# Patient Record
Sex: Female | Born: 1960 | Race: Black or African American | Hispanic: No | Marital: Married | Smoking: Never smoker
Health system: Southern US, Community
[De-identification: ages and names within clinical notes are randomized; demographics above are authoritative.]

## PROBLEM LIST (undated history)

## (undated) DIAGNOSIS — E785 Hyperlipidemia, unspecified: Secondary | ICD-10-CM

## (undated) DIAGNOSIS — F419 Anxiety disorder, unspecified: Secondary | ICD-10-CM

## (undated) DIAGNOSIS — I10 Essential (primary) hypertension: Secondary | ICD-10-CM

## (undated) HISTORY — PX: COLONOSCOPY: SHX174

## (undated) HISTORY — PX: POLYPECTOMY: SHX149

## (undated) HISTORY — DX: Essential (primary) hypertension: I10

## (undated) HISTORY — DX: Anxiety disorder, unspecified: F41.9

## (undated) HISTORY — DX: Hyperlipidemia, unspecified: E78.5

---
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
## 2000-09-29 ENCOUNTER — Other Ambulatory Visit: Admission: RE | Admit: 2000-09-29 | Discharge: 2000-09-29 | Payer: Self-pay | Admitting: Obstetrics and Gynecology

## 2001-11-28 ENCOUNTER — Other Ambulatory Visit: Admission: RE | Admit: 2001-11-28 | Discharge: 2001-11-28 | Payer: Self-pay | Admitting: Obstetrics and Gynecology

## 2003-02-04 ENCOUNTER — Other Ambulatory Visit: Admission: RE | Admit: 2003-02-04 | Discharge: 2003-02-04 | Payer: Self-pay | Admitting: Obstetrics and Gynecology

## 2003-10-20 ENCOUNTER — Encounter: Admission: RE | Admit: 2003-10-20 | Discharge: 2003-10-20 | Payer: Self-pay | Admitting: Family Medicine

## 2004-03-08 ENCOUNTER — Other Ambulatory Visit: Admission: RE | Admit: 2004-03-08 | Discharge: 2004-03-08 | Payer: Self-pay | Admitting: Obstetrics and Gynecology

## 2005-04-13 ENCOUNTER — Other Ambulatory Visit: Admission: RE | Admit: 2005-04-13 | Discharge: 2005-04-13 | Payer: Self-pay | Admitting: Obstetrics and Gynecology

## 2008-08-28 ENCOUNTER — Encounter: Payer: Self-pay | Admitting: Internal Medicine

## 2008-09-16 ENCOUNTER — Ambulatory Visit: Payer: Self-pay | Admitting: Internal Medicine

## 2008-09-30 ENCOUNTER — Telehealth: Payer: Self-pay | Admitting: Internal Medicine

## 2008-11-14 ENCOUNTER — Ambulatory Visit: Payer: Self-pay | Admitting: Internal Medicine

## 2008-11-24 ENCOUNTER — Encounter: Payer: Self-pay | Admitting: Internal Medicine

## 2008-11-24 ENCOUNTER — Ambulatory Visit: Payer: Self-pay | Admitting: Internal Medicine

## 2008-11-25 ENCOUNTER — Encounter: Payer: Self-pay | Admitting: Internal Medicine

## 2009-12-15 ENCOUNTER — Encounter: Admission: RE | Admit: 2009-12-15 | Discharge: 2009-12-15 | Payer: Self-pay | Admitting: Obstetrics and Gynecology

## 2010-03-09 NOTE — Miscellaneous (Signed)
Summary: [facility] Previsit/prep  Clinical Lists Changes  Medications: Added new medication of DULCOLAX 5 MG  TBEC (BISACODYL) Day before procedure take 2 at 3pm and 2 at 8pm. - Signed Added new medication of METOCLOPRAMIDE HCL 10 MG  TABS (METOCLOPRAMIDE HCL) As per prep instructions. - Signed Added new medication of MIRALAX   POWD (POLYETHYLENE GLYCOL 3350) As per prep  instructions. - Signed Rx of DULCOLAX 5 MG  TBEC (BISACODYL) Day before procedure take 2 at 3pm and 2 at 8pm.;  #4 x 0;  Signed;  Entered by: Wyona Almas RN;  Authorized by: Hart Carwin MD;  Method used: Electron[facility] to [facility]  [phone]*, [address] [place], [place], [place]  [address], Ph: [phone] or [phone], Fax: [phone] Rx of METOCLOPRAMIDE HCL 10 MG  TABS (METOCLOPRAMIDE HCL) As per prep instructions.;  #2 x 0;  Signed;  Entered by: Wyona Almas RN;  Authorized by: Hart Carwin MD;  Method used: Electronically to [facility]  [phone]*, [address] [place], [place], [place]  [address], Ph: [phone] or [phone], Fax: [phone] Rx of MIRALAX   POWD (POLYETHYLENE GLYCOL 3350) As per prep  instructions.;  #255gm x 0;  Signed;  Entered by: Wyona Almas RN;  Authorized by: Hart Carwin MD;  Method used: [facility]  [phone]*, [address] [place], [place], [place]  [address], Ph: [phone] or [phone], Fax: [phone] Observations: Added new observation of NKA: T (09/16/2008 16:23)    Prescriptions: MIRALAX   POWD (POLYETHYLENE GLYCOL 3350) As per prep  instructions.  #255gm x 0   Entered by:   Wyona Almas RN   Authorized by:   Hart Carwin MD   Signed by:   Wyona Almas RN on 09/16/2008   Method used:   [facility] to        [facility]  [address] [phone]* (retail)       [address] [place], [place]  [address]       Ph: [phone] or [phone]       Fax: [phone]   RxID:   [id number] METOCLOPRAMIDE HCL 10 MG  TABS (METOCLOPRAMIDE HCL) As per prep  instructions.  #2 x 0   Entered by:   Wyona Almas RN   Authorized by:   Hart Carwin MD   Signed by:   Wyona Almas RN on 09/16/2008   Method used:   [facility]ronically to        [facility]  [address] [phone][facility] (retail)       [address]       [place], [place]  [address]       Ph: [phone] or [phone]       Fax: [phone]   RxID:   [id number] DULCOLAX 5 MG  TBEC (BISACODYL) Day before procedure take 2 at 3pm and 2 at 8pm.  #4 x 0   Entered by:   Wyona Almas RN   Authorized by:   Hart Carwin MD   Signed by:   Wyona Almas RN on 09/16/2008   Method used:   [facility]ronically to        [facility]  [address] [phone]* (retail)       [address]       [place], [place]  [address]       Ph: [phone] or [phone]       Fax: [phone]   RxID:   [id number]

## 2010-03-09 NOTE — Miscellaneous (Signed)
Summary: [facility] previsit  Clinical Lists Changes  Medications: Added new medication of MIRALAX   POWD (POLYETHYLENE GLYCOL 3350) As per prep  instructions. - Signed Added new medication of REGLAN 10 MG  TABS (METOCLOPRAMIDE HCL) As per prep instructions. - Signed Added new medication of DULCOLAX 5 MG  TBEC (BISACODYL) Day before procedure take 2 at 3pm and 2 at 8pm. - Signed Rx of MIRALAX   POWD (POLYETHYLENE GLYCOL 3350) As per prep  instructions.;  #255gm x 0;  Signed;  Entered by: Ulis Rias RN;  Authorized by: Hart Carwin MD;  Method used: [facility]  [address] [phone]*, [address] [place], [place], [place]  [address], Ph: [phone] or [phone], Fax: [phone] Rx of REGLAN 10 MG  TABS (METOCLOPRAMIDE HCL) As per prep instructions.;  #2 x 0;  Signed;  Entered by: Ulis Rias RN;  Authorized by: Hart Carwin MD;  Method used: [facility]  [address] [phone]*, [address] [place], [place], [place]  [address], Ph: [phone] or [phone], Fax: [phone] Rx of DULCOLAX 5 MG  TBEC (BISACODYL) Day before procedure take 2 at 3pm and 2 at 8pm.;  #4 x 0;  Signed;  Entered by: Ulis Rias RN;  Authorized by: Hart Carwin MD;  Method used: [facility] [phone]*, [address] [place], [place], [place]  [address], Ph: [phone] or [phone], Fax: [phone] Observations: Added new observation of NKA: T (11/14/2008 16:17)    Prescriptions: DULCOLAX 5 MG  TBEC (BISACODYL) Day before procedure take 2 at 3pm and 2 at 8pm.  #4 x 0   Entered by:   Ulis Rias RN   Authorized by:   Hart Carwin MD   Signed by:   Ulis Rias RN on 11/14/2008   Method used:   [facility]ron[facility] to        [facility]  [address] [phone][facility] (retail)       [address] [place], [place]  [address]       Ph: [phone] or [phone]       Fax: [phone]   RxID:   [id number] REGLAN 10 MG  TABS (METOCLOPRAMIDE HCL) As per prep instructions.  #2 x 0   Entered by:    Ulis Rias RN   Authorized by:   Hart Carwin MD   Signed by:   Ulis Rias RN on 11/14/2008   Method used:   [facility]ronically to        [facility]  [address] [phone]* (retail)       [address]       [place], [place]  [address]       Ph: [phone] or [phone]       Fax: [phone]   RxID:   [id number] MIRALAX   POWD (POLYETHYLENE GLYCOL 3350) As per prep  instructions.  #255gm x 0   Entered by:   Ulis Rias RN   Authorized by:   Hart Carwin MD   Signed by:   Ulis Rias RN on 11/14/2008   Method used:   [facility]        [facility]  [address] [phone]* (retail)       [address]       [place], [place]  [address]  Ph: [phone] or [phone]       Fax: [phone]   RxID:   [id number]

## 2010-03-09 NOTE — Progress Notes (Signed)
Summary: ? re prep instructions  Phone Note Call from Patient Call back at [phone]   Caller: Patient Call For: Juanda Chance Reason for Call: Talk to Nurse Summary of Call: Patient has questiopns regarding prep instructions Initial call taken by: Tawni Levy,  September 30, 2008 2:03 PM  Follow-up for Phone Call        pt was scheduled for a morning procedure.  rescheduled for afternoon colonoscopy.  Wanted to know if prep would be different for an afternoon colonoscopy.  pt would need to change to MoviPrep.  Pt decided to reschedule colonoscopy for am so that she can use Miralax prep that she has already purchased. Follow-up by: Ezra Sites RN,  September 30, 2008 2:15 PM

## 2010-03-09 NOTE — Procedures (Signed)
Summary: Colonoscopy  Patient: Linda Barajas Note: All result statuses are Final unless otherwise noted.  Tests: (1) Colonoscopy (COL)   COL Colonoscopy           DONE     [facility]     [address]     [place], [place]  [address]           COLONOSCOPY PROCEDURE REPORT           PATIENT:  [name], [name]  MR#:  [id number]     BIRTHDATE:  [birth year], 48 yrs. old  GENDER:  female           ENDOSCOPIST:  Novie Maggio. Juanda Chance, MD     Referred by:  Richardean Chimera, M.D.           PROCEDURE DATE:  11/24/2008     PROCEDURE:  Colonoscopy 21308     ASA CLASS:  Class I     INDICATIONS:  Routine Risk Screening           MEDICATIONS:   Versed 10 mg, Fentanyl 100 mcg           DESCRIPTION OF PROCEDURE:   After the risks benefits and     alternatives of the procedure were thoroughly explained, informed     consent was obtained.  Digital rectal exam was performed and     revealed no rectal masses.   The LB PCF-H180AL [id number] endoscope     was introduced through the anus and advanced to the cecum, which     was identified by both the appendix and ileocecal valve, without     limitations.  The quality of the prep was good, using MiraLax.     The instrument was then slowly withdrawn as the colon was fully     examined.     <<PROCEDUREIMAGES>>           FINDINGS:  Two polyps were found in the ascending colon. 4 mm     right colon polyps removed with cold biopsy, 13 mm sessile polyp     rtemoved with hot snare from the cecum Polyp was snared, then     cauterized with monopolar cautery. Retrieval was successful. snare     polyp The polyp was removed using cold biopsy forceps (see image1,     image4, and image3).  This was otherwise a normal examination of     the colon (see image5 and image2).   Retroflexed views in the     rectum revealed no abnormalities.    The scope was then withdrawn     from the patient and the procedure completed.           COMPLICATIONS:  None           ENDOSCOPIC  IMPRESSION:     1) Two polyps in the ascending colon     2) Otherwise normal examination     s/p polypectomy x2 hot snare and a cold biopsy, right colon     polyps     RECOMMENDATIONS:     1) Await pathology results     2) high fiber diet           REPEAT EXAM:  In 3 - 5 year(s) for.  depending on the path report           ______________________________     Hedwig Morton. Juanda Chance, MD           CC:  n.     eSIGNED:   Lycia Sachdeva. Tessy Pawelski at 11/24/2008 11:26 AM           Chapman Moss, [phone]  Note: An exclamation mark (!) indicates a result that was not dispersed into the flowsheet. Document Creation Date: 11/24/2008 11:25 AM _______________________________________________________________________  (1) Order result status: Final Collection or observation date-time: 11/24/2008 11:18 Requested date-time:  Receipt date-time:  Reported date-time:  Referring Physician:   Ordering Physician: Lina Sar [phone]) Specimen Source:  Source: Launa Grill Order Number: [id number] Lab site:   Appended Document: Colonoscopy     Procedures Next Due Date:    Colonoscopy: 11/2011

## 2010-03-09 NOTE — Letter (Signed)
Summary: [facility]  [facility]   Imported By: Sherian Rein 12/16/2008 13:52:44  _____________________________________________________________________  External Attachment:    Type:   Image     Comment:   External Document

## 2010-03-09 NOTE — Letter (Signed)
Summary: Patient Notice- Colon Biospy Results  [facility]  [address] [place], [place] [address]   Phone: [phone]  Fax: [phone]        November 25, 2008 MRN: [id number]    Linda Barajas 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  Please follow-up with      your primary care physician for your other healthcare needs.  __Please call [phone] to schedule a return visit to review      your condition.  __Continue with the treatment plan as outlined on the day of your      exam.  x__You should have a repeat colonoscopy examination for this problem           in 3_ years.  Please call us if you are having persistent problems or have questions about your condition that have not been fully answered at this time.  Sincerely,  Hart Carwin MD   This letter has been electronically signed by your physician.  Appended Document: Patient Notice- Colon Biospy Results Letter mailed 10.20.10.

## 2010-12-29 ENCOUNTER — Other Ambulatory Visit: Payer: Self-pay | Admitting: Obstetrics and Gynecology

## 2010-12-29 DIAGNOSIS — R928 Other abnormal and inconclusive findings on diagnostic imaging of breast: Secondary | ICD-10-CM

## 2011-01-03 ENCOUNTER — Other Ambulatory Visit: Payer: Self-pay | Admitting: Obstetrics and Gynecology

## 2011-01-03 DIAGNOSIS — R928 Other abnormal and inconclusive findings on diagnostic imaging of breast: Secondary | ICD-10-CM

## 2011-01-14 ENCOUNTER — Ambulatory Visit
Admission: RE | Admit: 2011-01-14 | Discharge: 2011-01-14 | Disposition: A | Discharge status: Home / Self Care | Payer: Private Health Insurance - Indemnity | Source: Ambulatory Visit | Attending: Obstetrics and Gynecology | Admitting: Obstetrics and Gynecology

## 2011-01-14 ENCOUNTER — Other Ambulatory Visit: Payer: Self-pay | Admitting: Obstetrics and Gynecology

## 2011-01-14 DIAGNOSIS — R928 Other abnormal and inconclusive findings on diagnostic imaging of breast: Secondary | ICD-10-CM

## 2011-09-27 ENCOUNTER — Encounter: Payer: Self-pay | Admitting: Internal Medicine

## 2011-10-26 IMAGING — US US BREAST L
1 series · 7 of 7 positions shown · non-contrast
Comparison: 11/30/2009 and earlier

CLINICAL DATA: The patient returns after screening study for
evaluation of the left breast after screening mammogram.

DIGITAL DIAGNOSTIC LEFT MAMMOGRAM  AND LEFT BREAST ULTRASOUND:

[Series 1: us breast left · 7 of 7 slices shown]
[im 1/7]
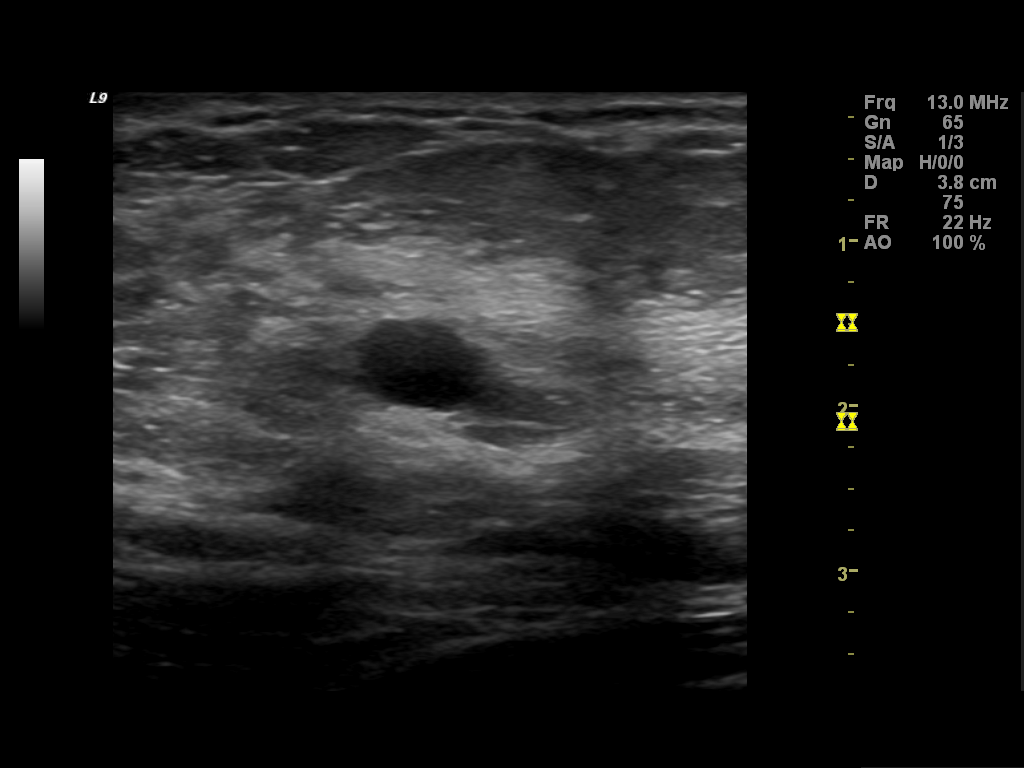
[im 2/7]
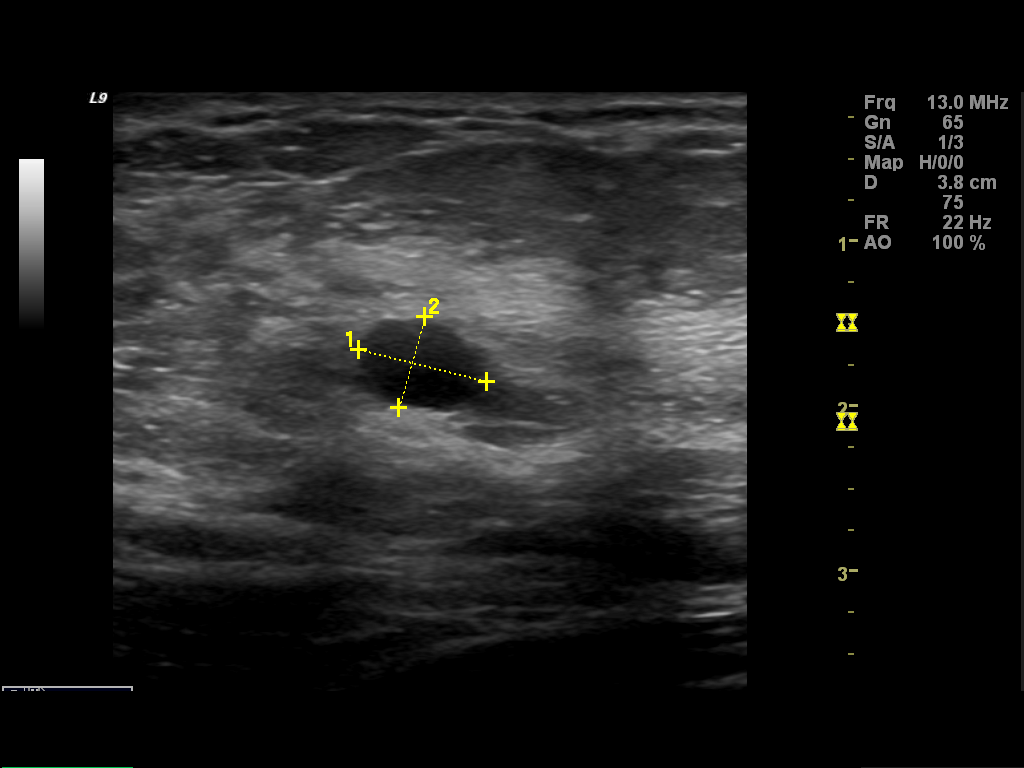
[im 3/7]
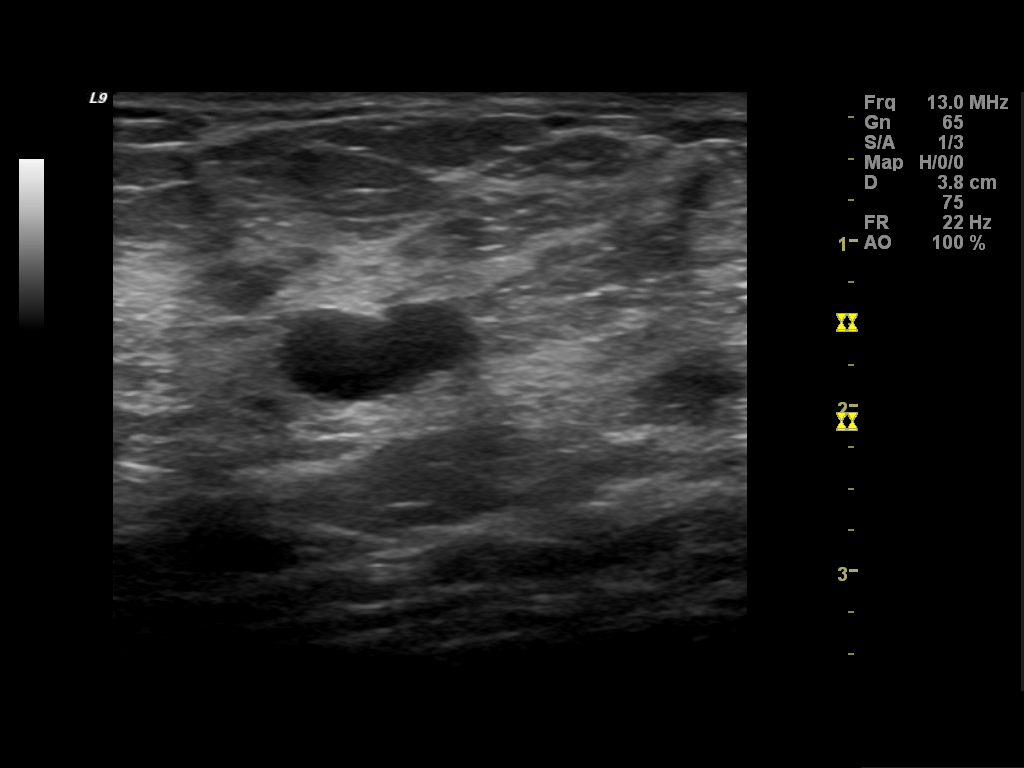
[im 4/7]
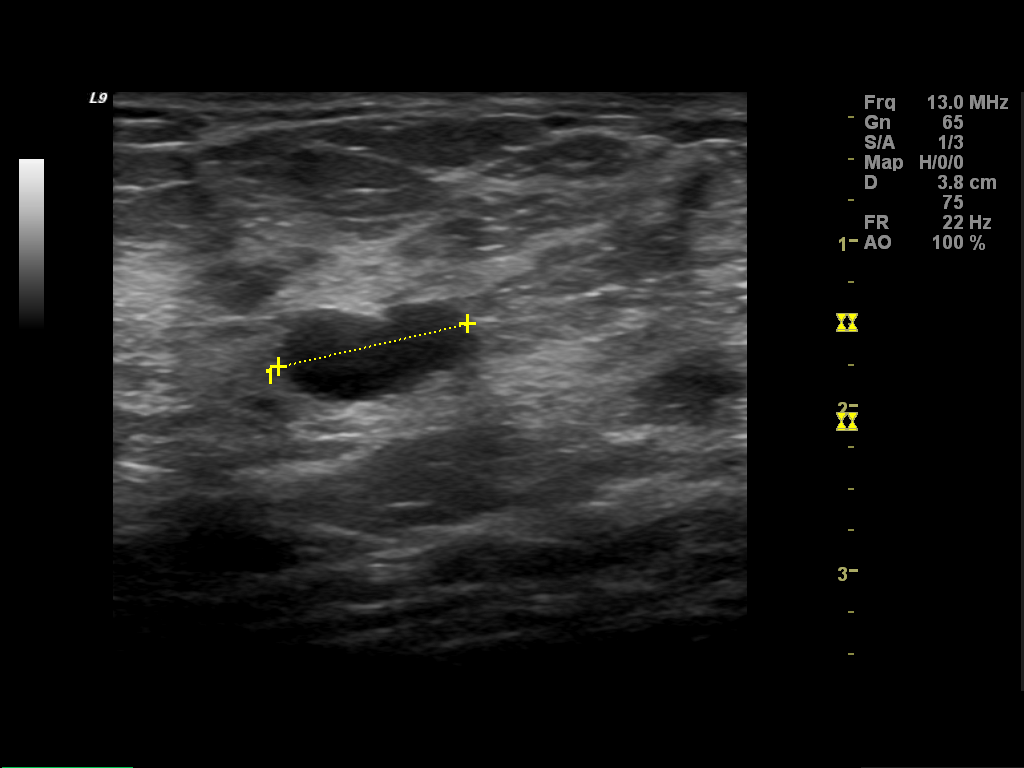
[im 5/7]
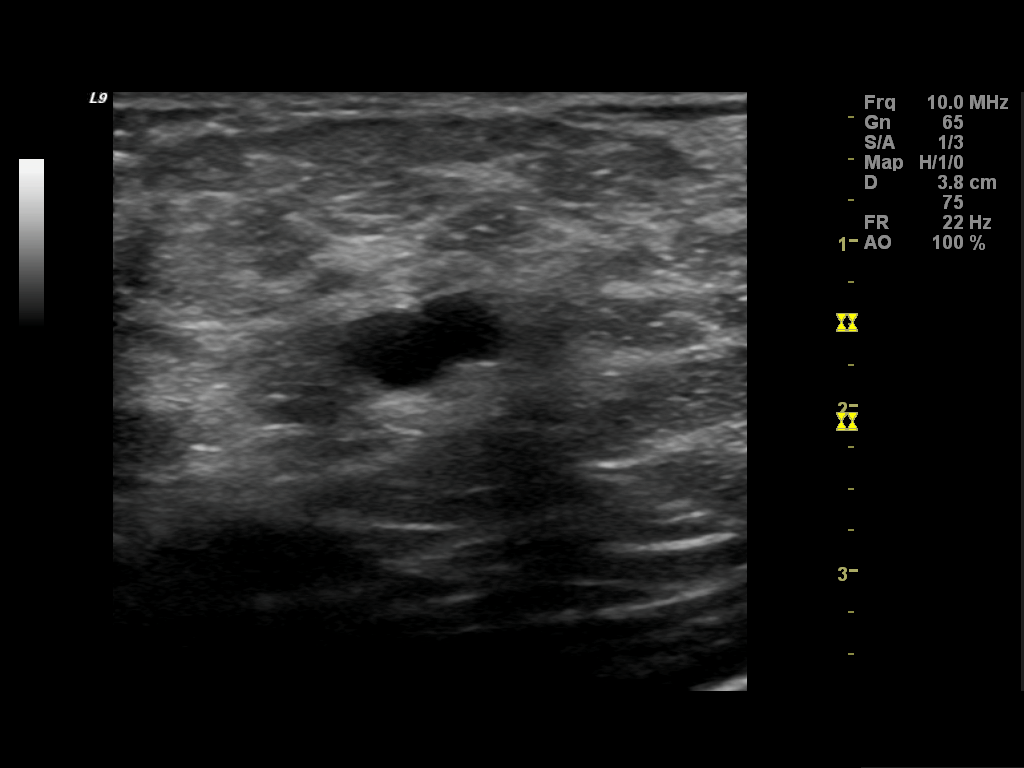
[im 6/7]
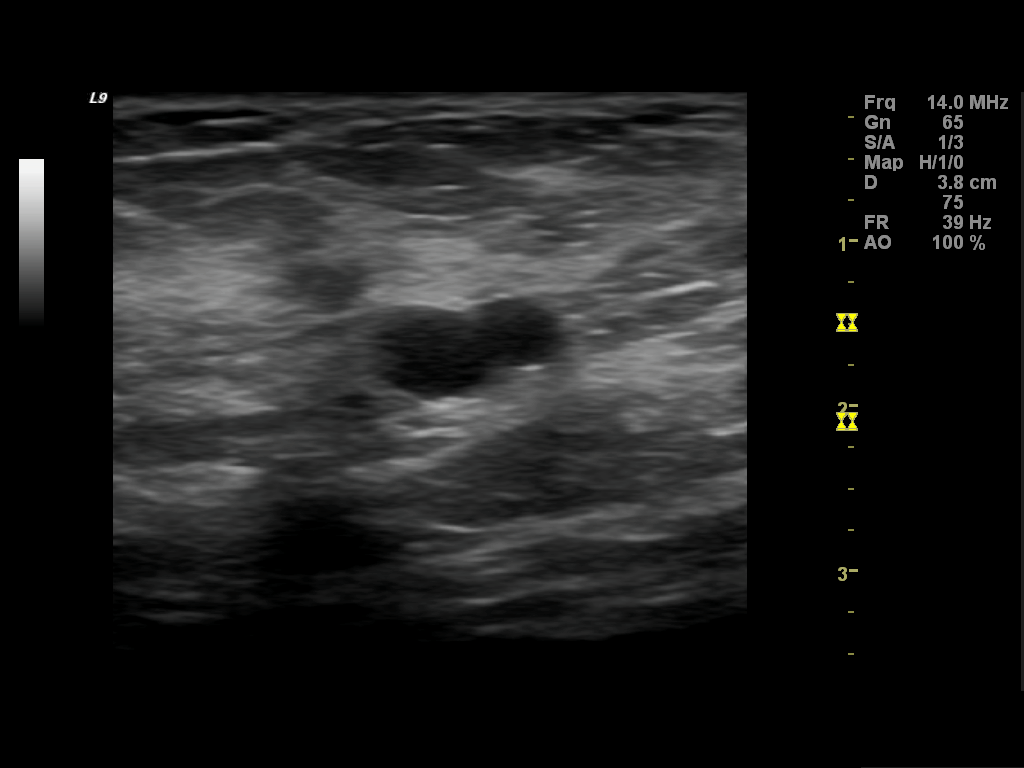
[im 7/7]
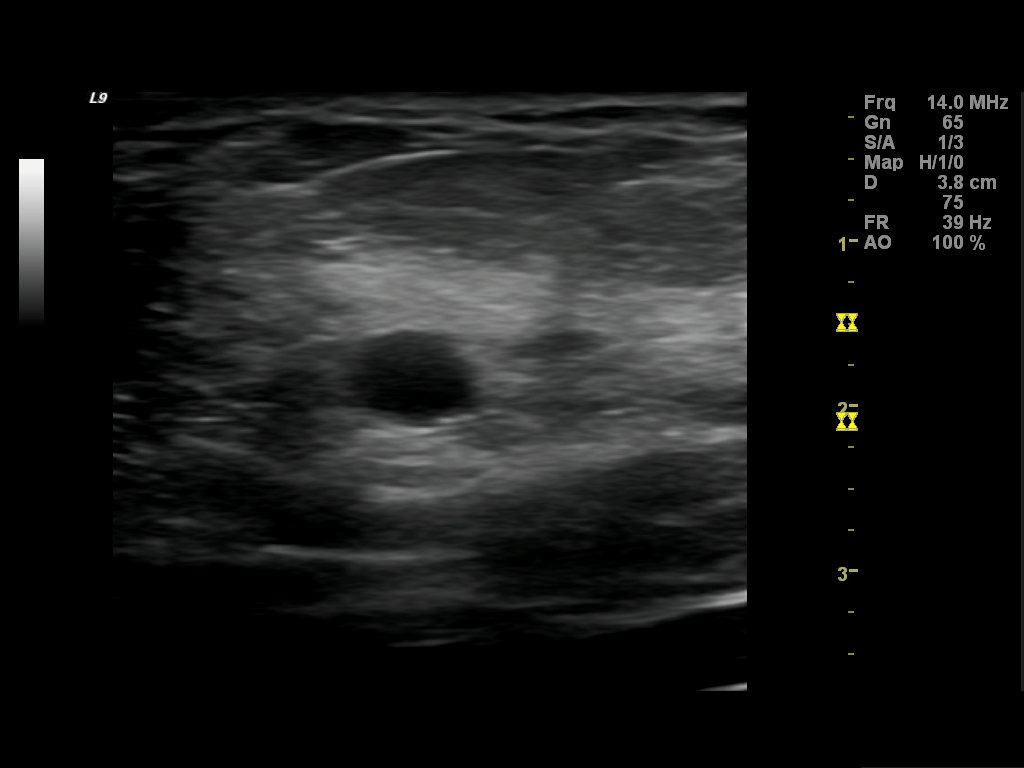

[7 of 7 positions shown; findings below may reference images not displayed]

FINDINGS: Spot compression views confirm presence of an oval
nodule in the upper outer quadrant of the left breast.  Some
margins are circumscribed.  Others are obscured.

On physical exam, I palpate no abnormality within the upper outer
quadrant of the left breast.

Ultrasound is performed, showing an oval cyst within the 1 o'clock
position of the left breast 3 cm from the nipple.  This measures
1.2 x 0.8 x 0.6 cm.  No solid mass or area of acoustic shadowing
identified in this region.
IMPRESSION: Persistent abnormality represents a benign cyst by ultrasound.
Screening mammogram is recommended in one year.

BI-RADS CATEGORY 2:  Benign finding(s).

## 2012-02-15 ENCOUNTER — Encounter: Payer: Self-pay | Admitting: Internal Medicine

## 2012-03-13 ENCOUNTER — Ambulatory Visit (AMBULATORY_SURGERY_CENTER): Payer: Private Health Insurance - Indemnity | Admitting: *Deleted

## 2012-03-13 VITALS — Ht 61.0 in | Wt 190.4 lb

## 2012-03-13 DIAGNOSIS — Z1211 Encounter for screening for malignant neoplasm of colon: Secondary | ICD-10-CM

## 2012-03-13 MED ORDER — MOVIPREP 100 G PO SOLR: ORAL | Status: DC

## 2012-03-14 ENCOUNTER — Encounter: Payer: Self-pay | Admitting: Internal Medicine

## 2012-03-21 ENCOUNTER — Encounter: Payer: Private Health Insurance - Indemnity | Admitting: Internal Medicine

## 2012-03-27 ENCOUNTER — Ambulatory Visit (AMBULATORY_SURGERY_CENTER): Payer: Private Health Insurance - Indemnity | Admitting: Internal Medicine

## 2012-03-27 ENCOUNTER — Encounter: Payer: Self-pay | Admitting: Internal Medicine

## 2012-03-27 VITALS — BP 110/72 | HR 70 | Temp 97.5°F | Resp 20 | Ht 61.0 in | Wt 190.0 lb

## 2012-03-27 DIAGNOSIS — D126 Benign neoplasm of colon, unspecified: Secondary | ICD-10-CM

## 2012-03-27 DIAGNOSIS — Z1211 Encounter for screening for malignant neoplasm of colon: Secondary | ICD-10-CM

## 2012-03-27 DIAGNOSIS — Z86010 Personal history of colonic polyps: Secondary | ICD-10-CM

## 2012-03-27 MED ORDER — SODIUM CHLORIDE 0.9 % IV SOLN: 500.0000 mL | INTRAVENOUS | Status: DC

## 2012-03-27 NOTE — Op Note (Signed)
[facility] [address] [place], [address]   COLONOSCOPY PROCEDURE REPORT  PATIENT: Linda, Barajas  MR#: [id number] BIRTHDATE: [birth year] , 51  yrs. old GENDER: Female ENDOSCOPIST: Hart Carwin, MD REFERRED BY:  Tally Joe, M.D. 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 MEDICATIONS: MAC sedation, administered by CRNA and propofol (Diprivan) 500mg  IV  DESCRIPTION OF PROCEDURE:   After the risks and benefits and of the procedure were explained, informed consent was obtained.  A digital rectal exam revealed no abnormalities of the rectum.    The LB CF-H180AL [id number]  endoscope was introduced through the anus and advanced to the cecum, which was identified by both the appendix and ileocecal valve .  The quality of the prep was good, using MoviPrep .  The instrument was then slowly withdrawn as the colon was fully examined.     COLON FINDINGS: Three polypoid shaped sessile polyps ranging between 3-42mm in size were found in the sigmoid colon. at 30 cm x2 and 25 cm x1  A polypectomy was performed with a cold snare and with cold forceps.  The resection was complete and the polyp tissue was completely retrieved.     Retroflexed views revealed no abnormalities.     The scope was then withdrawn from the patient and the procedure completed.  COMPLICATIONS: There were no complications. ENDOSCOPIC IMPRESSION: Three sessile polyps ranging between 3-27mm in size were found in the sigmoid colon; polypectomy was performed with a cold snare and with cold forceps  RECOMMENDATIONS: 1.  Await pathology results 2.  High fiber diet   REPEAT EXAM: In 5 year(s)  for Colonoscopy.  cc:  _______________________________ eSignedHart Carwin, MD 03/27/2012 2:16 PM     PATIENT NAME:  Linda, Barajas MR#:  [id number]

## 2012-03-27 NOTE — Progress Notes (Signed)
Patient did not experience any of the following events: a burn prior to discharge; a fall within the facility; wrong site/side/patient/procedure/implant event; or a hospital transfer or hospital admission upon discharge from the facility. (G8907) Patient did not have preoperative order for IV antibiotic SSI prophylaxis. (G8918)  

## 2012-03-27 NOTE — Progress Notes (Signed)
Patient with flat affect. Questioned patient frequently if she was in pain or uncomfortable. Patient stating she is fine. Questioned husband if patient comfortable related to quietness, husband stating she is ok.

## 2012-03-27 NOTE — Patient Instructions (Signed)
YOU HAD AN ENDOSCOPIC PROCEDURE TODAY AT THE [facility]: Refer to the procedure report that was given to you for any specific questions about what was found during the examination.  If the procedure report does not answer your questions, please call your gastroenterologist to clarify.  If you requested that your care partner not be given the details of your procedure findings, then the procedure report has been included in a sealed envelope for you to review at your convenience later.  YOU SHOULD EXPECT: Some feelings of bloating in the abdomen. Passage of more gas than usual.  Walking can help get rid of the air that was put into your GI tract during the procedure and reduce the bloating. If you had a lower endoscopy (such as a colonoscopy or flexible sigmoidoscopy) you may notice spotting of blood in your stool or on the toilet paper. If you underwent a bowel prep for your procedure, then you may not have a normal bowel movement for a few days.  DIET: Your first meal following the procedure should be a light meal and then it is ok to progress to your normal diet.  A half-sandwich or bowl of soup is an example of a good first meal.  Heavy or fried foods are harder to digest and may make you feel nauseous or bloated.  Likewise meals heavy in dairy and vegetables can cause extra gas to form and this can also increase the bloating.  Drink plenty of fluids but you should avoid alcoholic beverages for 24 hours.  ACTIVITY: Your care partner should take you home directly after the procedure.  You should plan to take it easy, moving slowly for the rest of the day.  You can resume normal activity the day after the procedure however you should NOT DRIVE or use heavy machinery for 24 hours (because of the sedation medicines used during the test).    SYMPTOMS TO REPORT IMMEDIATELY: A gastroenterologist can be reached at any hour.  During normal business hours, 8:30 AM to 5:00 PM Monday through Friday,  call [phone].  After hours and on weekends, please call the GI answering service at [phone] who will take a message and have the physician on call contact you.   Following lower endoscopy (colonoscopy or flexible sigmoidoscopy):  Excessive amounts of blood in the stool  Significant tenderness or worsening of abdominal pains  Swelling of the abdomen that is new, acute  Fever of 100F or higher    FOLLOW UP: If any biopsies were taken you will be contacted by phone or by letter within the next 1-3 weeks.  Call your gastroenterologist if you have not heard about the biopsies in 3 weeks.  Our staff will call the home number listed on your records the next business day following your procedure to check on you and address any questions or concerns that you may have at that time regarding the information given to you following your procedure. This is a courtesy call and so if there is no answer at the home number and we have not heard from you through the emergency physician on call, we will assume that you have returned to your regular daily activities without incident.  SIGNATURES/CONFIDENTIALITY: You and/or your care partner have signed paperwork which will be entered into your electronic medical record.  These signatures attest to the fact that that the information above on your After Visit Summary has been reviewed and is understood.  Full responsibility of the confidentiality   of this discharge information lies with you and/or your care-partner.     

## 2012-03-28 ENCOUNTER — Telehealth: Payer: Self-pay | Admitting: *Deleted

## 2012-03-28 NOTE — Telephone Encounter (Signed)
No answer, left message to call if questions or concerns. 

## 2012-04-02 ENCOUNTER — Encounter: Payer: Self-pay | Admitting: Internal Medicine

## 2012-11-24 IMAGING — US US BREAST L
1 series · 10 of 10 positions shown · non-contrast
Comparison: 12/21/2010, 11/30/2009, and 11/25/2008

CLINICAL DATA: Possible mass left breast identified on recent
screening mammogram.

DIGITAL DIAGNOSTIC LEFT MAMMOGRAM WITHOUT CAD AND LEFT BREAST
ULTRASOUND:

[Series 1: us breast left · 10 of 10 slices shown]
[im 1/10]
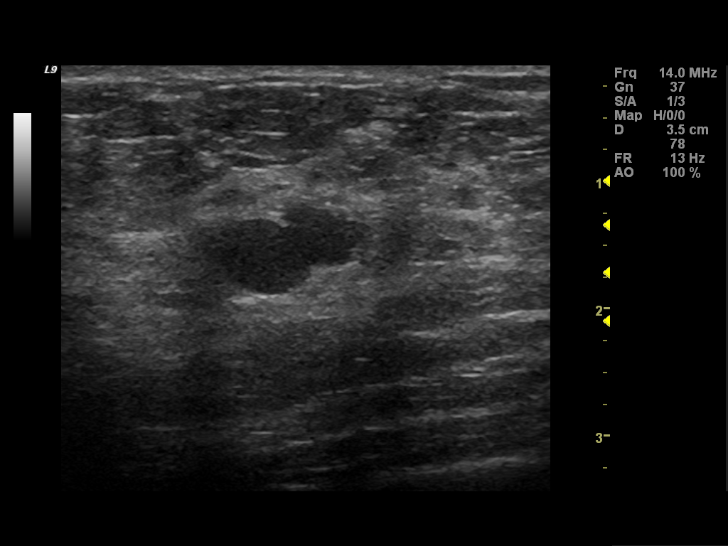
[im 2/10]
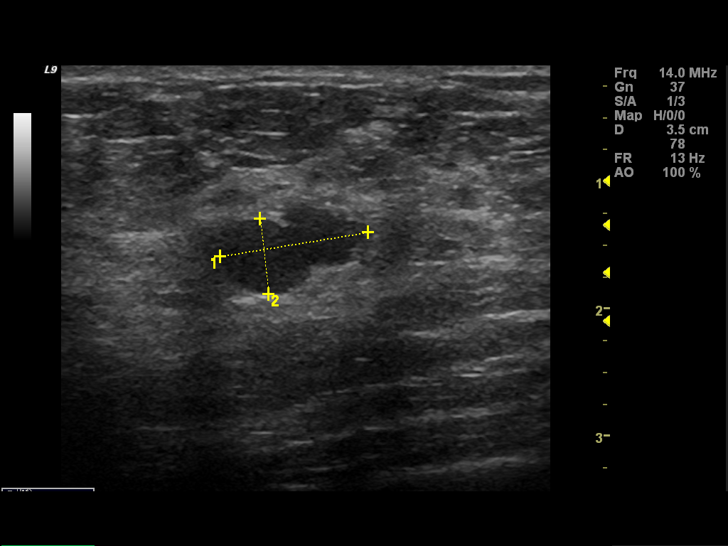
[im 3/10]
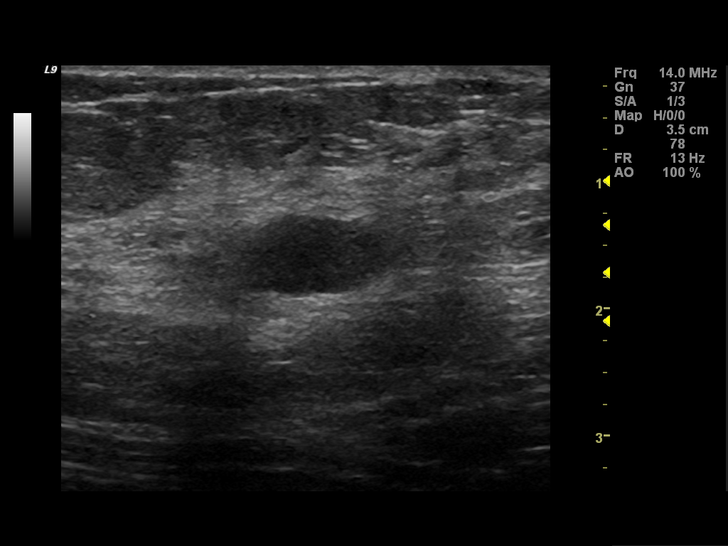
[im 4/10]
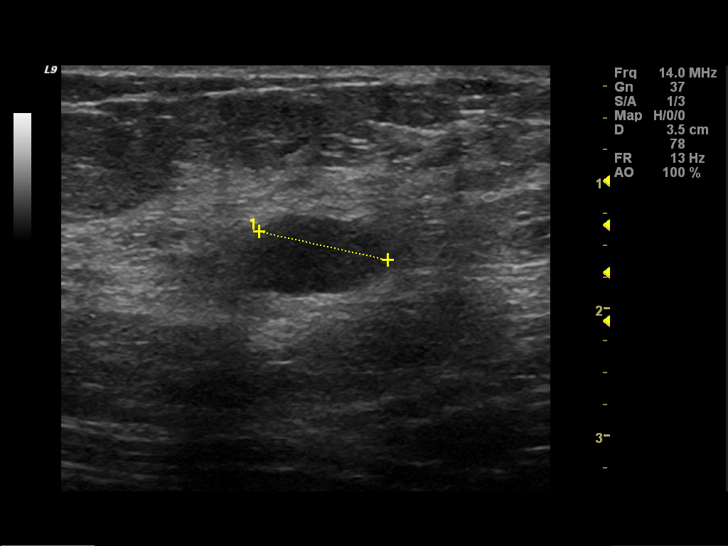
[im 5/10]
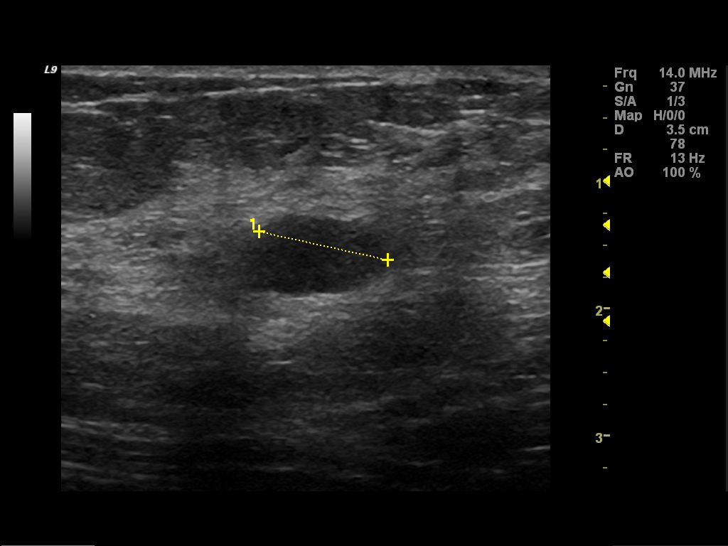
[im 6/10]
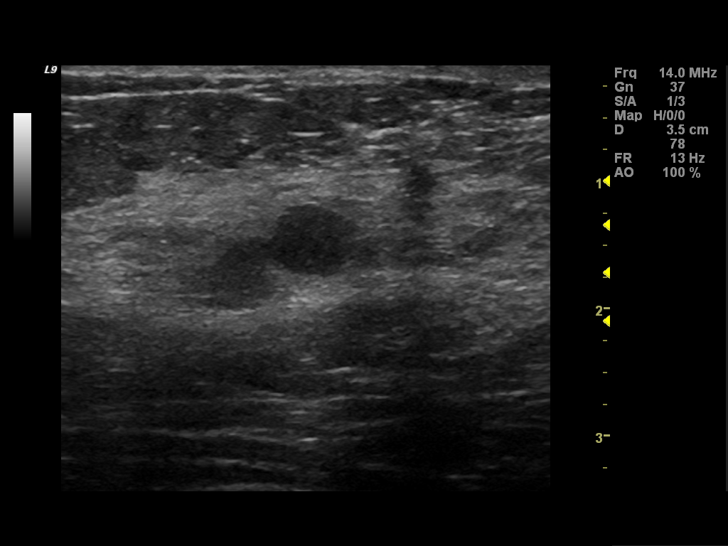
[im 7/10]
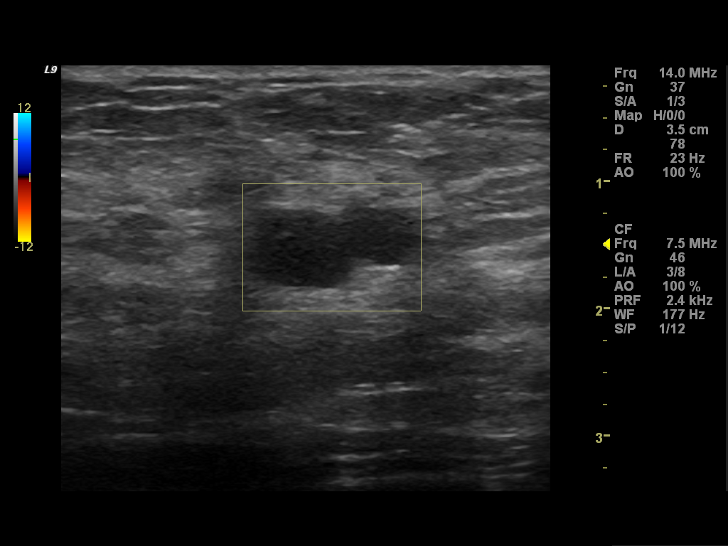
[im 8/10]
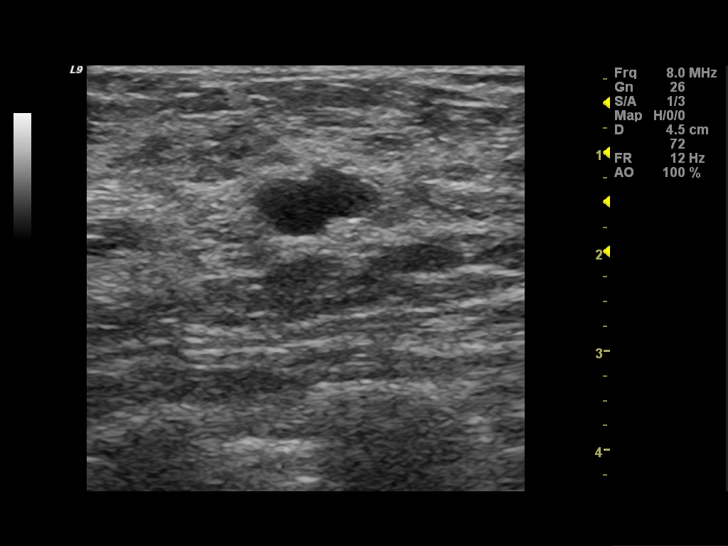
[im 9/10]
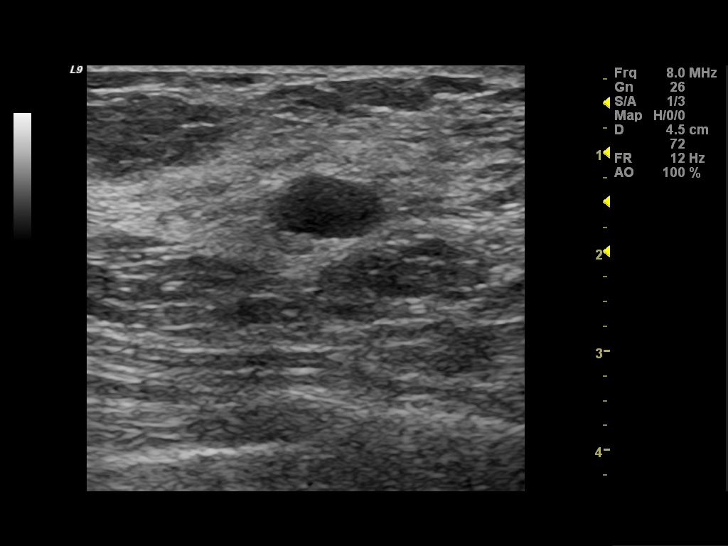
[im 10/10]
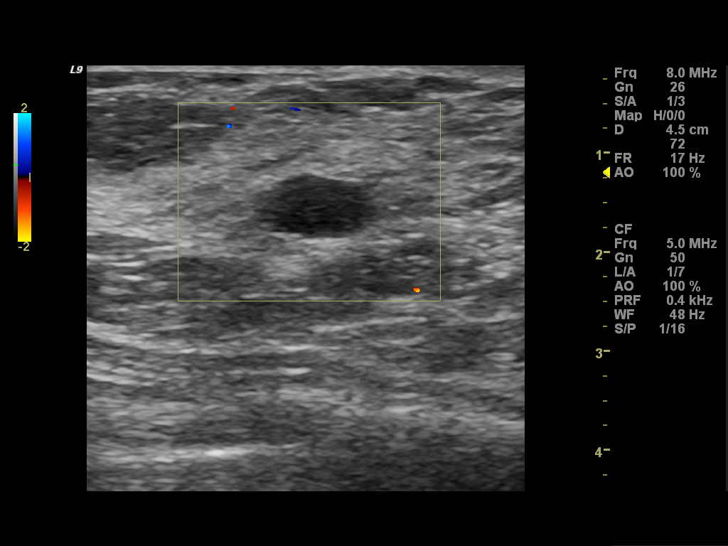

[10 of 10 positions shown; findings below may reference images not displayed]

FINDINGS: Spot compression views of the upper central left breast
confirm a circumscribed  oval mass with some internal
calcification.  The mass measures 13 mm by mammography.  This mass
and the small benign appearing calcificaitons are stable compared
to the diagnostic mammogram December 2009.

On physical exam, no mass is palpated in the upper left breast.

Ultrasound is performed, showing a circumscribed oval gently
lobulated anechoic mass with posterior acoustic enhancement and
some internal layering calcifications.  This is consistent with a
simple cyst and is stable.  It measures 1.2 x 0.6 x 1.0 cm there is
no internal vascular flow.
IMPRESSION: Stable simple cyst with some internal calcification 12 o'clock
position left breast.  No evidence of malignancy.  Bilateral
screening mammogram in December 2011 is recommended.

BI-RADS CATEGORY 2:  Benign finding(s).

## 2014-08-04 ENCOUNTER — Encounter: Payer: Self-pay | Admitting: Internal Medicine

## 2015-05-27 DIAGNOSIS — Z13820 Encounter for screening for osteoporosis: Secondary | ICD-10-CM | POA: Diagnosis not present

## 2015-07-24 DIAGNOSIS — E7800 Pure hypercholesterolemia, unspecified: Secondary | ICD-10-CM | POA: Diagnosis not present

## 2015-07-24 DIAGNOSIS — R41840 Attention and concentration deficit: Secondary | ICD-10-CM | POA: Diagnosis not present

## 2015-07-24 DIAGNOSIS — S90212A Contusion of left great toe with damage to nail, initial encounter: Secondary | ICD-10-CM | POA: Diagnosis not present

## 2015-07-24 DIAGNOSIS — R0600 Dyspnea, unspecified: Secondary | ICD-10-CM | POA: Diagnosis not present

## 2015-07-24 DIAGNOSIS — Z0000 Encounter for general adult medical examination without abnormal findings: Secondary | ICD-10-CM | POA: Diagnosis not present

## 2015-07-24 DIAGNOSIS — I10 Essential (primary) hypertension: Secondary | ICD-10-CM | POA: Diagnosis not present

## 2016-01-22 DIAGNOSIS — I10 Essential (primary) hypertension: Secondary | ICD-10-CM | POA: Diagnosis not present

## 2016-01-22 DIAGNOSIS — E785 Hyperlipidemia, unspecified: Secondary | ICD-10-CM | POA: Diagnosis not present

## 2016-03-10 DIAGNOSIS — J209 Acute bronchitis, unspecified: Secondary | ICD-10-CM | POA: Diagnosis not present

## 2016-03-10 DIAGNOSIS — R05 Cough: Secondary | ICD-10-CM | POA: Diagnosis not present

## 2016-03-25 DIAGNOSIS — E7800 Pure hypercholesterolemia, unspecified: Secondary | ICD-10-CM | POA: Diagnosis not present

## 2016-03-25 DIAGNOSIS — R05 Cough: Secondary | ICD-10-CM | POA: Diagnosis not present

## 2016-03-25 DIAGNOSIS — R42 Dizziness and giddiness: Secondary | ICD-10-CM | POA: Diagnosis not present

## 2016-03-25 DIAGNOSIS — I10 Essential (primary) hypertension: Secondary | ICD-10-CM | POA: Diagnosis not present

## 2016-06-21 DIAGNOSIS — Z01419 Encounter for gynecological examination (general) (routine) without abnormal findings: Secondary | ICD-10-CM | POA: Diagnosis not present

## 2016-06-21 DIAGNOSIS — Z1231 Encounter for screening mammogram for malignant neoplasm of breast: Secondary | ICD-10-CM | POA: Diagnosis not present

## 2016-06-21 DIAGNOSIS — Z6833 Body mass index (BMI) 33.0-33.9, adult: Secondary | ICD-10-CM | POA: Diagnosis not present

## 2016-09-02 DIAGNOSIS — E7800 Pure hypercholesterolemia, unspecified: Secondary | ICD-10-CM | POA: Diagnosis not present

## 2016-09-02 DIAGNOSIS — I10 Essential (primary) hypertension: Secondary | ICD-10-CM | POA: Diagnosis not present

## 2016-09-02 DIAGNOSIS — Z0000 Encounter for general adult medical examination without abnormal findings: Secondary | ICD-10-CM | POA: Diagnosis not present

## 2017-03-28 ENCOUNTER — Encounter: Payer: Self-pay | Admitting: Gastroenterology

## 2017-05-02 DIAGNOSIS — I10 Essential (primary) hypertension: Secondary | ICD-10-CM | POA: Diagnosis not present

## 2017-05-02 DIAGNOSIS — E785 Hyperlipidemia, unspecified: Secondary | ICD-10-CM | POA: Diagnosis not present

## 2017-05-02 DIAGNOSIS — B001 Herpesviral vesicular dermatitis: Secondary | ICD-10-CM | POA: Diagnosis not present

## 2017-07-07 ENCOUNTER — Encounter: Payer: Self-pay | Admitting: Gastroenterology

## 2017-08-01 DIAGNOSIS — Z6835 Body mass index (BMI) 35.0-35.9, adult: Secondary | ICD-10-CM | POA: Diagnosis not present

## 2017-08-01 DIAGNOSIS — Z01419 Encounter for gynecological examination (general) (routine) without abnormal findings: Secondary | ICD-10-CM | POA: Diagnosis not present

## 2017-08-01 DIAGNOSIS — Z1231 Encounter for screening mammogram for malignant neoplasm of breast: Secondary | ICD-10-CM | POA: Diagnosis not present

## 2017-08-01 DIAGNOSIS — Z13820 Encounter for screening for osteoporosis: Secondary | ICD-10-CM | POA: Diagnosis not present

## 2017-09-06 DIAGNOSIS — Z1159 Encounter for screening for other viral diseases: Secondary | ICD-10-CM | POA: Diagnosis not present

## 2017-09-06 DIAGNOSIS — Z0000 Encounter for general adult medical examination without abnormal findings: Secondary | ICD-10-CM | POA: Diagnosis not present

## 2017-09-06 DIAGNOSIS — Z23 Encounter for immunization: Secondary | ICD-10-CM | POA: Diagnosis not present

## 2017-09-06 DIAGNOSIS — I10 Essential (primary) hypertension: Secondary | ICD-10-CM | POA: Diagnosis not present

## 2017-09-06 DIAGNOSIS — E785 Hyperlipidemia, unspecified: Secondary | ICD-10-CM | POA: Diagnosis not present

## 2017-09-11 ENCOUNTER — Ambulatory Visit (AMBULATORY_SURGERY_CENTER): Payer: Self-pay | Admitting: *Deleted

## 2017-09-11 VITALS — Ht 62.0 in | Wt 195.2 lb

## 2017-09-11 DIAGNOSIS — Z86010 Personal history of colonic polyps: Secondary | ICD-10-CM

## 2017-09-11 MED ORDER — SUPREP BOWEL PREP KIT 17.5-3.13-1.6 GM/177ML PO SOLN: 1.0000 | Freq: Once | ORAL | 0 refills | Status: AC

## 2017-09-11 NOTE — Progress Notes (Signed)
No egg or soy allergy known to patient  No issues with past sedation with any surgeries  or procedures, no intubation problems  No diet pills per patient No home 02 use per patient  No blood thinners per patient  Pt denies issues with constipation  No A fib or A flutter  EMMI video sent to pt's e mail  

## 2017-09-13 ENCOUNTER — Encounter: Payer: Self-pay | Admitting: Gastroenterology

## 2017-09-25 ENCOUNTER — Ambulatory Visit (AMBULATORY_SURGERY_CENTER): Payer: BLUE CROSS/BLUE SHIELD | Admitting: Gastroenterology

## 2017-09-25 ENCOUNTER — Encounter: Payer: Self-pay | Admitting: Gastroenterology

## 2017-09-25 VITALS — BP 112/72 | HR 52 | Temp 98.4°F | Resp 22 | Ht 62.0 in | Wt 195.0 lb

## 2017-09-25 DIAGNOSIS — Z86010 Personal history of colonic polyps: Secondary | ICD-10-CM | POA: Diagnosis not present

## 2017-09-25 DIAGNOSIS — D123 Benign neoplasm of transverse colon: Secondary | ICD-10-CM

## 2017-09-25 DIAGNOSIS — D120 Benign neoplasm of cecum: Secondary | ICD-10-CM

## 2017-09-25 DIAGNOSIS — D122 Benign neoplasm of ascending colon: Secondary | ICD-10-CM

## 2017-09-25 MED ORDER — SODIUM CHLORIDE 0.9 % IV SOLN
500.0000 mL | Freq: Once | INTRAVENOUS | Status: DC
Start: 2017-09-25 — End: 2021-08-27

## 2017-09-25 NOTE — Progress Notes (Signed)
Report to PACU, RN, vss, BBS= Clear.  

## 2017-09-25 NOTE — Patient Instructions (Addendum)
INFORMATION ON POLYS GIVEN. CARD WITH CLIP INFORMATION GIVEN. 1 TEASPOON BENEFIBER 3 TIMES PER DAY WITH MEALS. PREP H AT BEDTIME AS NEEDED NO NASAIDS SUCH AS IBUPROFEN OR MOTRIN FOR TWO WEEKS.   YOU HAD AN ENDOSCOPIC PROCEDURE TODAY AT [facility]:   Refer to the procedure report that was given to you for any specific questions about what was found during the examination.  If the procedure report does not answer your questions, please call your gastroenterologist to clarify.  If you requested that your care partner not be given the details of your procedure findings, then the procedure report has been included in a sealed envelope for you to review at your convenience later.  YOU SHOULD EXPECT: Some feelings of bloating in the abdomen. Passage of more gas than usual.  Walking can help get rid of the air that was put into your GI tract during the procedure and reduce the bloating. If you had a lower endoscopy (such as a colonoscopy or flexible sigmoidoscopy) you may notice spotting of blood in your stool or on the toilet paper. If you underwent a bowel prep for your procedure, you may not have a normal bowel movement for a few days.  Please Note:  You might notice some irritation and congestion in your nose or some drainage.  This is from the oxygen used during your procedure.  There is no need for concern and it should clear up in a day or so.  SYMPTOMS TO REPORT IMMEDIATELY:   Following lower endoscopy (colonoscopy or flexible sigmoidoscopy):  Excessive amounts of blood in the stool  Significant tenderness or worsening of abdominal pains  Swelling of the abdomen that is new, acute  Fever of 100F or higher   Following upper endoscopy (EGD)  Vomiting of blood or coffee ground material  New chest pain or pain under the shoulder blades  Painful or persistently difficult swallowing  New shortness of breath  Fever of 100F or higher  Black, tarry-looking stools  For urgent  or emergent issues, a gastroenterologist can be reached at any hour by calling [phone].   DIET:  We do recommend a small meal at first, but then you may proceed to your regular diet.  Drink plenty of fluids but you should avoid alcoholic beverages for 24 hours.  ACTIVITY:  You should plan to take it easy for the rest of today and you should NOT DRIVE or use heavy machinery until tomorrow (because of the sedation medicines used during the test).    FOLLOW UP: Our staff will call the number listed on your records the next business day following your procedure to check on you and address any questions or concerns that you may have regarding the information given to you following your procedure. If we do not reach you, we will leave a message.  However, if you are feeling well and you are not experiencing any problems, there is no need to return our call.  We will assume that you have returned to your regular daily activities without incident.  If any biopsies were taken you will be contacted by phone or by letter within the next 1-3 weeks.  Please call us at [phone] if you have not heard about the biopsies in 3 weeks.    SIGNATURES/CONFIDENTIALITY: You and/or your care partner have signed paperwork which will be entered into your electronic medical record.  These signatures attest to the fact that that the information above on your After  Visit Summary has been reviewed and is understood.  Full responsibility of the confidentiality of this discharge information lies with you and/or your care-partner. 

## 2017-09-25 NOTE — Progress Notes (Signed)
Pt's states no medical or surgical changes since previsit or office visit. 

## 2017-09-25 NOTE — Progress Notes (Signed)
Called to room to assist during endoscopic procedure.  Patient ID and intended procedure confirmed with present staff. Received instructions for my participation in the procedure from the performing physician.  

## 2017-09-25 NOTE — Op Note (Signed)
[place] Patient Name: Linda Barajas Procedure Date: 09/25/2017 9:37 AM MRN: [id number] Endoscopist: Mauri Pole , MD Age: 57 Referring MD:  Date of Birth: December 04, 1960 Gender: Female Account #: [id number] Procedure:                Colonoscopy Indications:              High risk colon cancer surveillance: Personal                            history of colonic polyps, Surveillance: Personal                            history of adenomatous polyps on last colonoscopy 5                            years ago, High risk colon cancer surveillance:                            Personal history of adenoma less than 10 mm in size Medicines:                Monitored Anesthesia Care Procedure:                Pre-Anesthesia Assessment:                           - Prior to the procedure, a History and Physical                            was performed, and patient medications and                            allergies were reviewed. The patient's tolerance of                            previous anesthesia was also reviewed. The risks                            and benefits of the procedure and the sedation                            options and risks were discussed with the patient.                            All questions were answered, and informed consent                            was obtained. Prior Anticoagulants: The patient has                            taken no previous anticoagulant or antiplatelet                            agents. ASA Grade Assessment: II - A patient with  mild systemic disease. After reviewing the risks                            and benefits, the patient was deemed in                            satisfactory condition to undergo the procedure.                           After obtaining informed consent, the colonoscope                            was passed under direct vision. Throughout the                            procedure,  the patient's blood pressure, pulse, and                            oxygen saturations were monitored continuously. The                            Colonoscope was introduced through the anus and                            advanced to the the terminal ileum, with                            identification of the appendiceal orifice and IC                            valve. The colonoscopy was performed without                            difficulty. The patient tolerated the procedure                            well. The quality of the bowel preparation was                            good. The terminal ileum, ileocecal valve,                            appendiceal orifice, and rectum were photographed. Scope In: 9:44:06 AM Scope Out: 10:10:43 AM Scope Withdrawal Time: 0 hours 22 minutes 9 seconds  Total Procedure Duration: 0 hours 26 minutes 37 seconds  Findings:                 The perianal and digital rectal examinations were                            normal.                           A 5 mm polyp was found in the cecum. The polyp was  sessile. The polyp was removed with a cold snare.                            Resection and retrieval were complete.                           A 5 mm polyp was found in the ascending colon. The                            polyp was pedunculated. The polyp was removed with                            a hot snare. Resection and retrieval were complete.                           Six sessile polyps were found in the transverse                            colon. The polyps were 1 to 3 mm in size. These                            polyps were removed with a cold biopsy forceps.                            Resection and retrieval were complete.                           A 5 mm polyp was found in the transverse colon. The                            polyp was sessile. The polyp was removed with a                            cold snare. Resection  and retrieval were complete.                            Coagulation for hemostasis using snare tip was                            unsuccessful. To prevent bleeding after the                            polypectomy, one hemostatic clip was successfully                            placed (MR conditional). There was no bleeding at                            the end of the procedure.                           Non-bleeding internal hemorrhoids were found during  retroflexion. The hemorrhoids were small.                           Scattered small and large-mouthed diverticula were                            found in the ascending colon. Complications:            No immediate complications. Estimated Blood Loss:     Estimated blood loss was minimal. Impression:               - One 5 mm polyp in the cecum, removed with a cold                            snare. Resected and retrieved.                           - One 5 mm polyp in the ascending colon, removed                            with a hot snare. Resected and retrieved.                           - Six 1 to 3 mm polyps in the transverse colon,                            removed with a cold biopsy forceps. Resected and                            retrieved.                           - One 5 mm polyp in the transverse colon, removed                            with a cold snare. Resected and retrieved.                            Treatment not successful. Treated with a hot snare.                            Clip (MR conditional) was placed.                           - Non-bleeding internal hemorrhoids.                           - Diverticulosis in the ascending colon. Recommendation:           - Patient has a contact number available for                            emergencies. The signs and symptoms of potential  delayed complications were discussed with the                            patient. Return  to normal activities tomorrow.                            Written discharge instructions were provided to the                            patient.                           - Resume previous diet.                           - Continue present medications.                           - Await pathology results.                           - Repeat colonoscopy in 3 years for surveillance                            based on pathology results. Mauri Pole, MD 09/25/2017 10:17:35 AM This report has been signed electronically.

## 2017-09-26 ENCOUNTER — Telehealth: Payer: Self-pay

## 2017-09-26 DIAGNOSIS — R710 Precipitous drop in hematocrit: Secondary | ICD-10-CM | POA: Diagnosis not present

## 2017-09-26 NOTE — Telephone Encounter (Signed)
  Follow up Call-  Call back number 09/25/2017  Post procedure Call Back phone  # [phone]  Permission to leave phone message Yes  Some recent data might be hidden     Patient questions:  Do you have a fever, pain , or abdominal swelling? No. Pain Score  0 *  Have you tolerated food without any problems? Yes.    Have you been able to return to your normal activities? Yes.    Do you have any questions about your discharge instructions: Diet   No. Medications  No. Follow up visit  No.  Do you have questions or concerns about your Care? No.  Actions: * If pain score is 4 or above: No action needed, pain <4.

## 2017-10-03 ENCOUNTER — Encounter: Payer: Self-pay | Admitting: Gastroenterology

## 2017-11-16 DIAGNOSIS — R42 Dizziness and giddiness: Secondary | ICD-10-CM | POA: Diagnosis not present

## 2018-03-14 DIAGNOSIS — R718 Other abnormality of red blood cells: Secondary | ICD-10-CM | POA: Diagnosis not present

## 2018-03-14 DIAGNOSIS — E785 Hyperlipidemia, unspecified: Secondary | ICD-10-CM | POA: Diagnosis not present

## 2018-03-14 DIAGNOSIS — E669 Obesity, unspecified: Secondary | ICD-10-CM | POA: Diagnosis not present

## 2018-03-14 DIAGNOSIS — I10 Essential (primary) hypertension: Secondary | ICD-10-CM | POA: Diagnosis not present

## 2018-09-12 DIAGNOSIS — I10 Essential (primary) hypertension: Secondary | ICD-10-CM | POA: Diagnosis not present

## 2018-09-12 DIAGNOSIS — Z0000 Encounter for general adult medical examination without abnormal findings: Secondary | ICD-10-CM | POA: Diagnosis not present

## 2018-09-12 DIAGNOSIS — R718 Other abnormality of red blood cells: Secondary | ICD-10-CM | POA: Diagnosis not present

## 2018-09-12 DIAGNOSIS — E785 Hyperlipidemia, unspecified: Secondary | ICD-10-CM | POA: Diagnosis not present

## 2018-09-14 DIAGNOSIS — R718 Other abnormality of red blood cells: Secondary | ICD-10-CM | POA: Diagnosis not present

## 2018-09-14 DIAGNOSIS — E785 Hyperlipidemia, unspecified: Secondary | ICD-10-CM | POA: Diagnosis not present

## 2018-09-14 DIAGNOSIS — I10 Essential (primary) hypertension: Secondary | ICD-10-CM | POA: Diagnosis not present

## 2018-09-14 DIAGNOSIS — Z8249 Family history of ischemic heart disease and other diseases of the circulatory system: Secondary | ICD-10-CM | POA: Diagnosis not present

## 2018-10-17 DIAGNOSIS — Z1231 Encounter for screening mammogram for malignant neoplasm of breast: Secondary | ICD-10-CM | POA: Diagnosis not present

## 2018-10-17 DIAGNOSIS — Z6835 Body mass index (BMI) 35.0-35.9, adult: Secondary | ICD-10-CM | POA: Diagnosis not present

## 2018-10-17 DIAGNOSIS — Z01419 Encounter for gynecological examination (general) (routine) without abnormal findings: Secondary | ICD-10-CM | POA: Diagnosis not present

## 2018-11-06 ENCOUNTER — Other Ambulatory Visit: Payer: Self-pay

## 2018-11-06 DIAGNOSIS — Z20822 Contact with and (suspected) exposure to covid-19: Secondary | ICD-10-CM

## 2018-11-08 LAB — NOVEL CORONAVIRUS, NAA: SARS-CoV-2, NAA: NOT DETECTED

## 2018-12-19 DIAGNOSIS — Z23 Encounter for immunization: Secondary | ICD-10-CM | POA: Diagnosis not present

## 2018-12-26 ENCOUNTER — Other Ambulatory Visit: Payer: Self-pay

## 2018-12-26 DIAGNOSIS — Z20822 Contact with and (suspected) exposure to covid-19: Secondary | ICD-10-CM

## 2018-12-28 LAB — NOVEL CORONAVIRUS, NAA: SARS-CoV-2, NAA: NOT DETECTED

## 2019-01-25 ENCOUNTER — Ambulatory Visit: Payer: BC Managed Care – PPO | Attending: Internal Medicine

## 2019-01-25 DIAGNOSIS — R238 Other skin changes: Secondary | ICD-10-CM | POA: Diagnosis not present

## 2019-01-25 DIAGNOSIS — U071 COVID-19: Secondary | ICD-10-CM

## 2019-01-26 LAB — NOVEL CORONAVIRUS, NAA: SARS-CoV-2, NAA: NOT DETECTED

## 2019-07-04 DIAGNOSIS — I10 Essential (primary) hypertension: Secondary | ICD-10-CM | POA: Diagnosis not present

## 2019-07-04 DIAGNOSIS — E669 Obesity, unspecified: Secondary | ICD-10-CM | POA: Diagnosis not present

## 2019-07-04 DIAGNOSIS — E785 Hyperlipidemia, unspecified: Secondary | ICD-10-CM | POA: Diagnosis not present

## 2019-07-04 DIAGNOSIS — R718 Other abnormality of red blood cells: Secondary | ICD-10-CM | POA: Diagnosis not present

## 2019-10-18 DIAGNOSIS — R6884 Jaw pain: Secondary | ICD-10-CM | POA: Diagnosis not present

## 2019-11-15 DIAGNOSIS — F418 Other specified anxiety disorders: Secondary | ICD-10-CM | POA: Diagnosis not present

## 2019-12-05 DIAGNOSIS — Z1231 Encounter for screening mammogram for malignant neoplasm of breast: Secondary | ICD-10-CM | POA: Diagnosis not present

## 2019-12-05 DIAGNOSIS — Z01419 Encounter for gynecological examination (general) (routine) without abnormal findings: Secondary | ICD-10-CM | POA: Diagnosis not present

## 2019-12-05 DIAGNOSIS — Z6837 Body mass index (BMI) 37.0-37.9, adult: Secondary | ICD-10-CM | POA: Diagnosis not present

## 2019-12-19 DIAGNOSIS — E785 Hyperlipidemia, unspecified: Secondary | ICD-10-CM | POA: Diagnosis not present

## 2019-12-19 DIAGNOSIS — I10 Essential (primary) hypertension: Secondary | ICD-10-CM | POA: Diagnosis not present

## 2019-12-19 DIAGNOSIS — E669 Obesity, unspecified: Secondary | ICD-10-CM | POA: Diagnosis not present

## 2019-12-19 DIAGNOSIS — F418 Other specified anxiety disorders: Secondary | ICD-10-CM | POA: Diagnosis not present

## 2019-12-19 DIAGNOSIS — Z0000 Encounter for general adult medical examination without abnormal findings: Secondary | ICD-10-CM | POA: Diagnosis not present

## 2020-02-03 ENCOUNTER — Other Ambulatory Visit: Payer: BC Managed Care – PPO

## 2020-02-03 DIAGNOSIS — Z20822 Contact with and (suspected) exposure to covid-19: Secondary | ICD-10-CM | POA: Diagnosis not present

## 2020-02-04 LAB — SARS-COV-2, NAA 2 DAY TAT

## 2020-02-04 LAB — NOVEL CORONAVIRUS, NAA: SARS-CoV-2, NAA: NOT DETECTED

## 2020-02-05 ENCOUNTER — Telehealth: Payer: Self-pay | Admitting: General Practice

## 2020-02-05 NOTE — Telephone Encounter (Signed)
Negative COVID results given. Patient results "NOT Detected." Caller expressed understanding. ° °

## 2020-03-15 DIAGNOSIS — Z23 Encounter for immunization: Secondary | ICD-10-CM | POA: Diagnosis not present

## 2020-06-17 DIAGNOSIS — E785 Hyperlipidemia, unspecified: Secondary | ICD-10-CM | POA: Diagnosis not present

## 2020-06-17 DIAGNOSIS — I10 Essential (primary) hypertension: Secondary | ICD-10-CM | POA: Diagnosis not present

## 2020-06-17 DIAGNOSIS — F418 Other specified anxiety disorders: Secondary | ICD-10-CM | POA: Diagnosis not present

## 2020-06-17 DIAGNOSIS — E669 Obesity, unspecified: Secondary | ICD-10-CM | POA: Diagnosis not present

## 2020-06-18 ENCOUNTER — Ambulatory Visit: Payer: BC Managed Care – PPO | Attending: Internal Medicine

## 2020-06-18 DIAGNOSIS — Z20822 Contact with and (suspected) exposure to covid-19: Secondary | ICD-10-CM

## 2020-06-20 LAB — NOVEL CORONAVIRUS, NAA: SARS-CoV-2, NAA: NOT DETECTED

## 2020-06-20 LAB — SARS-COV-2, NAA 2 DAY TAT

## 2020-11-05 ENCOUNTER — Encounter: Payer: Self-pay | Admitting: Gastroenterology

## 2020-12-07 DIAGNOSIS — H1132 Conjunctival hemorrhage, left eye: Secondary | ICD-10-CM | POA: Diagnosis not present

## 2020-12-23 DIAGNOSIS — E669 Obesity, unspecified: Secondary | ICD-10-CM | POA: Diagnosis not present

## 2020-12-23 DIAGNOSIS — R718 Other abnormality of red blood cells: Secondary | ICD-10-CM | POA: Diagnosis not present

## 2020-12-23 DIAGNOSIS — I10 Essential (primary) hypertension: Secondary | ICD-10-CM | POA: Diagnosis not present

## 2020-12-23 DIAGNOSIS — Z0000 Encounter for general adult medical examination without abnormal findings: Secondary | ICD-10-CM | POA: Diagnosis not present

## 2020-12-23 DIAGNOSIS — E785 Hyperlipidemia, unspecified: Secondary | ICD-10-CM | POA: Diagnosis not present

## 2021-01-27 DIAGNOSIS — Z01419 Encounter for gynecological examination (general) (routine) without abnormal findings: Secondary | ICD-10-CM | POA: Diagnosis not present

## 2021-01-27 DIAGNOSIS — Z6838 Body mass index (BMI) 38.0-38.9, adult: Secondary | ICD-10-CM | POA: Diagnosis not present

## 2021-01-27 DIAGNOSIS — Z13820 Encounter for screening for osteoporosis: Secondary | ICD-10-CM | POA: Diagnosis not present

## 2021-01-27 DIAGNOSIS — Z1231 Encounter for screening mammogram for malignant neoplasm of breast: Secondary | ICD-10-CM | POA: Diagnosis not present

## 2021-01-28 ENCOUNTER — Other Ambulatory Visit: Payer: Self-pay | Admitting: Obstetrics and Gynecology

## 2021-01-28 DIAGNOSIS — Z8249 Family history of ischemic heart disease and other diseases of the circulatory system: Secondary | ICD-10-CM

## 2021-03-29 ENCOUNTER — Other Ambulatory Visit: Payer: BC Managed Care – PPO

## 2021-04-08 ENCOUNTER — Ambulatory Visit
Admission: RE | Admit: 2021-04-08 | Discharge: 2021-04-08 | Disposition: A | Discharge status: Home / Self Care | Payer: No Typology Code available for payment source | Source: Ambulatory Visit | Attending: Obstetrics and Gynecology | Admitting: Obstetrics and Gynecology

## 2021-04-08 DIAGNOSIS — Z8249 Family history of ischemic heart disease and other diseases of the circulatory system: Secondary | ICD-10-CM

## 2021-06-09 DIAGNOSIS — L299 Pruritus, unspecified: Secondary | ICD-10-CM | POA: Diagnosis not present

## 2021-06-22 DIAGNOSIS — E785 Hyperlipidemia, unspecified: Secondary | ICD-10-CM | POA: Diagnosis not present

## 2021-06-22 DIAGNOSIS — I10 Essential (primary) hypertension: Secondary | ICD-10-CM | POA: Diagnosis not present

## 2021-06-22 DIAGNOSIS — R718 Other abnormality of red blood cells: Secondary | ICD-10-CM | POA: Diagnosis not present

## 2021-06-22 DIAGNOSIS — E669 Obesity, unspecified: Secondary | ICD-10-CM | POA: Diagnosis not present

## 2021-06-23 ENCOUNTER — Encounter: Payer: Self-pay | Admitting: Gastroenterology

## 2021-06-30 ENCOUNTER — Ambulatory Visit (AMBULATORY_SURGERY_CENTER): Payer: Self-pay | Admitting: *Deleted

## 2021-06-30 VITALS — Ht 63.0 in | Wt 209.2 lb

## 2021-06-30 DIAGNOSIS — Z86010 Personal history of colonic polyps: Secondary | ICD-10-CM

## 2021-06-30 MED ORDER — NA SULFATE-K SULFATE-MG SULF 17.5-3.13-1.6 GM/177ML PO SOLN: 2.0000 | Freq: Once | ORAL | 0 refills | Status: AC

## 2021-06-30 NOTE — Progress Notes (Signed)
No egg or soy allergy known to patient  No issues known to pt with past sedation with any surgeries or procedures Patient denies ever being told they had issues or difficulty with intubation  No FH of Malignant Hyperthermia Pt is not on diet pills Pt is not on  home 02  Pt is not on blood thinners  Pt denies issues with constipation  No A fib or A flutter   PV completed in person. Pt verified name, DOB, address and insurance during PV today. Procedure and prep instructions given and explained to pt. Questions answered.  Pt encouraged to call with questions or issues.   Insurance confirmed with pt at [facility] today

## 2021-08-24 ENCOUNTER — Encounter: Payer: Self-pay | Admitting: Gastroenterology

## 2021-08-27 ENCOUNTER — Ambulatory Visit (AMBULATORY_SURGERY_CENTER): Payer: BC Managed Care – PPO | Admitting: Gastroenterology

## 2021-08-27 ENCOUNTER — Encounter: Payer: Self-pay | Admitting: Gastroenterology

## 2021-08-27 VITALS — BP 118/80 | HR 61 | Temp 97.7°F | Resp 19 | Ht 62.0 in | Wt 209.2 lb

## 2021-08-27 DIAGNOSIS — D125 Benign neoplasm of sigmoid colon: Secondary | ICD-10-CM

## 2021-08-27 DIAGNOSIS — Z09 Encounter for follow-up examination after completed treatment for conditions other than malignant neoplasm: Secondary | ICD-10-CM

## 2021-08-27 DIAGNOSIS — Z86010 Personal history of colonic polyps: Secondary | ICD-10-CM

## 2021-08-27 DIAGNOSIS — Z1211 Encounter for screening for malignant neoplasm of colon: Secondary | ICD-10-CM | POA: Diagnosis not present

## 2021-08-27 DIAGNOSIS — D123 Benign neoplasm of transverse colon: Secondary | ICD-10-CM

## 2021-08-27 MED ORDER — SODIUM CHLORIDE 0.9 % IV SOLN: 500.0000 mL | Freq: Once | INTRAVENOUS | Status: DC

## 2021-08-27 NOTE — Op Note (Signed)
[place] Patient Name: Linda Barajas Procedure Date: 08/27/2021 9:34 AM MRN: [id number] Endoscopist: Mauri Pole , MD Age: 61 Referring MD:  Date of Birth: 1960/04/14 Gender: Female Account #: [id number] Procedure:                Colonoscopy Indications:              High risk colon cancer surveillance: Personal                            history of colonic polyps, High risk colon cancer                            surveillance: Personal history of adenoma (10 mm or                            greater in size), High risk colon cancer                            surveillance: Personal history of multiple (3 or                            more) adenomas Medicines:                Monitored Anesthesia Care Procedure:                Pre-Anesthesia Assessment:                           - Prior to the procedure, a History and Physical                            was performed, and patient medications and                            allergies were reviewed. The patient's tolerance of                            previous anesthesia was also reviewed. The risks                            and benefits of the procedure and the sedation                            options and risks were discussed with the patient.                            All questions were answered, and informed consent                            was obtained. Prior Anticoagulants: The patient has                            taken no previous anticoagulant or antiplatelet  agents. ASA Grade Assessment: II - A patient with                            mild systemic disease. After reviewing the risks                            and benefits, the patient was deemed in                            satisfactory condition to undergo the procedure.                           After obtaining informed consent, the colonoscope                            was passed under direct vision. Throughout the                             procedure, the patient's blood pressure, pulse, and                            oxygen saturations were monitored continuously. The                            Olympus PCF-H190DL (#[id number]) Colonoscope was                            introduced through the anus and advanced to the the                            cecum, identified by appendiceal orifice and                            ileocecal valve. The colonoscopy was performed                            without difficulty. The patient tolerated the                            procedure well. The quality of the bowel                            preparation was good. The ileocecal valve,                            appendiceal orifice, and rectum were photographed. Scope In: 9:41:41 AM Scope Out: 9:55:12 AM Scope Withdrawal Time: 0 hours 10 minutes 11 seconds  Total Procedure Duration: 0 hours 13 minutes 31 seconds  Findings:                 The perianal and digital rectal examinations were                            normal.  Three sessile polyps were found in the transverse                            colon. The polyps were 1 to 2 mm in size. These                            polyps were removed with a cold biopsy forceps.                            Resection and retrieval were complete.                           A 14 mm polyp was found in the sigmoid colon. The                            polyp was pedunculated with surface mucosal                            erythema and ulceration. The polyp was removed with                            a hot snare. Resection and retrieval were complete.                           A few small-mouthed diverticula were found in the                            sigmoid colon and descending colon.                           Non-bleeding external and internal hemorrhoids were                            found during retroflexion. The hemorrhoids were                             medium-sized. Complications:            No immediate complications. Estimated Blood Loss:     Estimated blood loss was minimal. Impression:               - Three 1 to 2 mm polyps in the transverse colon,                            removed with a cold biopsy forceps. Resected and                            retrieved.                           - One 14 mm polyp in the sigmoid colon, removed                            with a hot snare. Resected and retrieved.                           -  Diverticulosis in the sigmoid colon and in the                            descending colon.                           - Non-bleeding external and internal hemorrhoids. Recommendation:           - Patient has a contact number available for                            emergencies. The signs and symptoms of potential                            delayed complications were discussed with the                            patient. Return to normal activities tomorrow.                            Written discharge instructions were provided to the                            patient.                           - Resume previous diet.                           - Continue present medications.                           - Await pathology results.                           - Repeat colonoscopy in 3 years for surveillance                            based on pathology results. Mauri Pole, MD 08/27/2021 10:02:07 AM This report has been signed electronically.

## 2021-08-27 NOTE — Progress Notes (Signed)
Called to room to assist during endoscopic procedure.  Patient ID and intended procedure confirmed with present staff. Received instructions for my participation in the procedure from the performing physician.  

## 2021-08-27 NOTE — Progress Notes (Signed)
[facility] History and Physical   Primary Care Physician:  Antony Contras, MD   Reason for Procedure:  History of adenomatous colon polyps  Plan:    Surveillance colonoscopy with possible interventions as needed     HPI: Linda Barajas is a very pleasant 61 y.o. female here for surveillance colonoscopy. Denies any nausea, vomiting, abdominal pain, melena or bright red blood per rectum  The risks and benefits as well as alternatives of endoscopic procedure(s) have been discussed and reviewed. All questions answered. The patient agrees to proceed.    Past Medical History:  Diagnosis Date   Anxiety    Hyperlipidemia    Hypertension     Past Surgical History:  Procedure Laterality Date   APPENDECTOMY  1966   COLONOSCOPY     POLYPECTOMY     TONSILLECTOMY  1968    Prior to Admission medications   Medication Sig Start Date End Date Taking? Authorizing Provider  amLODipine (NORVASC) 10 MG tablet Take 10 mg by mouth daily. 07/02/17  Yes [provider]  losartan (COZAAR) 50 MG tablet Take 50 mg by mouth daily. 07/02/17  Yes [provider]  rosuvastatin (CRESTOR) 5 MG tablet Take 5 mg by mouth daily. 08/05/17  Yes [provider]  acyclovir (ZOVIRAX) 400 MG tablet acyclovir 400 mg tablet    [provider]  betamethasone valerate ointment (VALISONE) 0.1 % Apply topically 2 (two) times daily. Patient not taking: Reported on 06/30/2021    [provider]  cetirizine (ZYRTEC) 10 MG tablet Take 10 mg by mouth daily. 06/09/21   [provider]    Current Outpatient Medications  Medication Sig Dispense Refill   amLODipine (NORVASC) 10 MG tablet Take 10 mg by mouth daily.  1   losartan (COZAAR) 50 MG tablet Take 50 mg by mouth daily.  1   rosuvastatin (CRESTOR) 5 MG tablet Take 5 mg by mouth daily.  1   acyclovir (ZOVIRAX) 400 MG tablet acyclovir 400 mg tablet     betamethasone valerate ointment (VALISONE) 0.1 % Apply  topically 2 (two) times daily. (Patient not taking: Reported on 06/30/2021)     cetirizine (ZYRTEC) 10 MG tablet Take 10 mg by mouth daily.     Current Facility-Administered Medications  Medication Dose Route Frequency Provider Last Rate Last Admin   0.9 %  sodium chloride infusion  500 mL Intravenous Once Mauri Pole, MD        Allergies as of 08/27/2021 - Review Complete 08/27/2021  Allergen Reaction Noted   Atorvastatin calcium Other (See Comments) 06/22/2021   Lisinopril-hydrochlorothiazide Other (See Comments) 06/22/2021   Other Other (See Comments) 09/11/2017    Family History  Problem Relation Age of Onset   Hypertension Mother    Colon cancer Neg Hx    Stomach cancer Neg Hx    Colon polyps Neg Hx    Esophageal cancer Neg Hx    Pancreatic cancer Neg Hx    Rectal cancer Neg Hx     Social History   Socioeconomic History   Marital status: Married    Spouse name: Not on file   Number of children: Not on file   Years of education: Not on file   Highest education level: Not on file  Occupational History   Not on file  Tobacco Use   Smoking status: Never   Smokeless tobacco: Never  Vaping Use   Vaping Use: Never used  Substance and Sexual Activity   Alcohol use: No  Drug use: No   Sexual activity: Yes    Birth control/protection: Post-menopausal  Other Topics Concern   Not on file  Social History Narrative   Not on file   Social Determinants of Health   Financial Resource Strain: Not on file  Food Insecurity: Not on file  Transportation Needs: Not on file  Physical Activity: Not on file  Stress: Not on file  Social Connections: Not on file  Intimate Partner Violence: Not on file    Review of Systems:  All other review of systems negative except as mentioned in the HPI.  Physical Exam: Vital signs in last 24 hours: BP 104/70   Pulse 70   Temp 97.7 F (36.5 C) (Temporal)   Ht '5\' 2"'$  (5.329 m)   Wt 209 lb 3.2 oz (94.9 kg)   LMP 11/27/2010    SpO2 96%   BMI 38.26 kg/m  General:   Alert, NAD Lungs:  Clear .   Heart:  Regular rate and rhythm Abdomen:  Soft, nontender and nondistended. Neuro/Psych:  Alert and cooperative. Normal mood and affect. A and O x 3  Reviewed labs, radiology imaging, old records and pertinent past GI work up  Patient is appropriate for planned procedure(s) and anesthesia in an ambulatory setting   K. Denzil Magnuson , MD [phone]

## 2021-08-27 NOTE — Patient Instructions (Signed)
Resume previous diet and medications. Awaiting pathology results. Repeat Colonoscopy in 3 years for surveillance.  YOU HAD AN ENDOSCOPIC PROCEDURE TODAY AT [facility]:   Refer to the procedure report that was given to you for any specific questions about what was found during the examination.  If the procedure report does not answer your questions, please call your gastroenterologist to clarify.  If you requested that your care partner not be given the details of your procedure findings, then the procedure report has been included in a sealed envelope for you to review at your convenience later.  YOU SHOULD EXPECT: Some feelings of bloating in the abdomen. Passage of more gas than usual.  Walking can help get rid of the air that was put into your GI tract during the procedure and reduce the bloating. If you had a lower endoscopy (such as a colonoscopy or flexible sigmoidoscopy) you may notice spotting of blood in your stool or on the toilet paper. If you underwent a bowel prep for your procedure, you may not have a normal bowel movement for a few days.  Please Note:  You might notice some irritation and congestion in your nose or some drainage.  This is from the oxygen used during your procedure.  There is no need for concern and it should clear up in a day or so.  SYMPTOMS TO REPORT IMMEDIATELY:  Following lower endoscopy (colonoscopy or flexible sigmoidoscopy):  Excessive amounts of blood in the stool  Significant tenderness or worsening of abdominal pains  Swelling of the abdomen that is new, acute  Fever of 100F or higher  For urgent or emergent issues, a gastroenterologist can be reached at any hour by calling [phone]. Do not use MyChart messaging for urgent concerns.    DIET:  We do recommend a small meal at first, but then you may proceed to your regular diet.  Drink plenty of fluids but you should avoid alcoholic beverages for 24 hours.  ACTIVITY:  You should  plan to take it easy for the rest of today and you should NOT DRIVE or use heavy machinery until tomorrow (because of the sedation medicines used during the test).    FOLLOW UP: Our staff will call the number listed on your records the next business day following your procedure.  We will call around 7:15- 8:00 am to check on you and address any questions or concerns that you may have regarding the information given to you following your procedure. If we do not reach you, we will leave a message.  If you develop any symptoms (ie: fever, flu-like symptoms, shortness of breath, cough etc.) before then, please call [phone].  If you test positive for Covid 19 in the 2 weeks post procedure, please call and report this information to [place].    If any biopsies were taken you will be contacted by phone or by letter within the next 1-3 weeks.  Please call us at [phone] if you have not heard about the biopsies in 3 weeks.    SIGNATURES/CONFIDENTIALITY: You and/or your care partner have signed paperwork which will be entered into your electronic medical record.  These signatures attest to the fact that that the information above on your After Visit Summary has been reviewed and is understood.  Full responsibility of the confidentiality of this discharge information lies with you and/or your care-partner.

## 2021-08-27 NOTE — Progress Notes (Signed)
Pt's states no medical or surgical changes since previsit or office visit. 

## 2021-08-27 NOTE — Progress Notes (Signed)
A and O x3. Report to RN. Tolerated MAC anesthesia well. 

## 2021-08-30 ENCOUNTER — Telehealth: Payer: Self-pay | Admitting: *Deleted

## 2021-08-30 NOTE — Telephone Encounter (Signed)
Attempted to call patient for their post-procedure follow-up call. No answer. Left voicemail.   

## 2021-09-27 ENCOUNTER — Encounter: Payer: Self-pay | Admitting: Gastroenterology

## 2021-12-16 DIAGNOSIS — I10 Essential (primary) hypertension: Secondary | ICD-10-CM | POA: Diagnosis not present

## 2021-12-16 DIAGNOSIS — E785 Hyperlipidemia, unspecified: Secondary | ICD-10-CM | POA: Diagnosis not present

## 2021-12-16 DIAGNOSIS — R718 Other abnormality of red blood cells: Secondary | ICD-10-CM | POA: Diagnosis not present

## 2021-12-16 DIAGNOSIS — E669 Obesity, unspecified: Secondary | ICD-10-CM | POA: Diagnosis not present

## 2021-12-16 DIAGNOSIS — Z0000 Encounter for general adult medical examination without abnormal findings: Secondary | ICD-10-CM | POA: Diagnosis not present

## 2022-02-02 DIAGNOSIS — Z23 Encounter for immunization: Secondary | ICD-10-CM | POA: Diagnosis not present

## 2022-04-06 DIAGNOSIS — Z6838 Body mass index (BMI) 38.0-38.9, adult: Secondary | ICD-10-CM | POA: Diagnosis not present

## 2022-04-06 DIAGNOSIS — Z1231 Encounter for screening mammogram for malignant neoplasm of breast: Secondary | ICD-10-CM | POA: Diagnosis not present

## 2022-04-06 DIAGNOSIS — Z01419 Encounter for gynecological examination (general) (routine) without abnormal findings: Secondary | ICD-10-CM | POA: Diagnosis not present

## 2022-04-13 DIAGNOSIS — N9089 Other specified noninflammatory disorders of vulva and perineum: Secondary | ICD-10-CM | POA: Diagnosis not present

## 2022-04-13 DIAGNOSIS — N763 Subacute and chronic vulvitis: Secondary | ICD-10-CM | POA: Diagnosis not present

## 2022-04-27 NOTE — Progress Notes (Signed)
Referring-John Radene Knee, MD Reason for referral-coronary calcification  HPI: 62 year old female for evaluation of coronary calcification at request of Arvella Nigh, MD.  Calcium score March 2023 13.1 (in left main) which was 76 percentile.  Patient has dyspnea with more vigorous activities but not routine activities.  No orthopnea, PND, exertional chest pain or syncope.  Occasional minimal pedal edema.  Cardiology now asked to evaluate.  Current Outpatient Medications  Medication Sig Dispense Refill   acyclovir (ZOVIRAX) 400 MG tablet acyclovir 400 mg tablet     amLODipine (NORVASC) 10 MG tablet Take 10 mg by mouth daily.  1   betamethasone valerate ointment (VALISONE) 0.1 % Apply topically 2 (two) times daily.     cetirizine (ZYRTEC) 10 MG tablet Take 10 mg by mouth daily.     losartan (COZAAR) 50 MG tablet Take 50 mg by mouth daily.  1   rosuvastatin (CRESTOR) 5 MG tablet Take 5 mg by mouth daily.  1   No current facility-administered medications for this visit.    Allergies  Allergen Reactions   Atorvastatin Calcium Other (See Comments)   Lisinopril-Hydrochlorothiazide Other (See Comments)   Other Other (See Comments)    Cholesterol medication, patient unsure of name Reaction is dizziness     Past Medical History:  Diagnosis Date   Anxiety    Hyperlipidemia    Hypertension     Past Surgical History:  Procedure Laterality Date   APPENDECTOMY  1966   COLONOSCOPY     POLYPECTOMY     TONSILLECTOMY  1968    Social History   Socioeconomic History   Marital status: Married    Spouse name: Not on file   Number of children: 3   Years of education: Not on file   Highest education level: Not on file  Occupational History   Not on file  Tobacco Use   Smoking status: Never   Smokeless tobacco: Never  Vaping Use   Vaping Use: Never used  Substance and Sexual Activity   Alcohol use: No   Drug use: No   Sexual activity: Yes    Birth control/protection:  Post-menopausal  Other Topics Concern   Not on file  Social History Narrative   Not on file   Social Determinants of Health   Financial Resource Strain: Not on file  Food Insecurity: Not on file  Transportation Needs: Not on file  Physical Activity: Not on file  Stress: Not on file  Social Connections: Not on file  Intimate Partner Violence: Not on file    Family History  Problem Relation Age of Onset   Hypertension Mother    Heart attack Father    Heart attack Brother    Colon cancer Neg Hx    Stomach cancer Neg Hx    Colon polyps Neg Hx    Esophageal cancer Neg Hx    Pancreatic cancer Neg Hx    Rectal cancer Neg Hx     ROS: no fevers or chills, productive cough, hemoptysis, dysphasia, odynophagia, melena, hematochezia, dysuria, hematuria, rash, seizure activity, orthopnea, PND, claudication. Remaining systems are negative.  Physical Exam:   Blood pressure 126/82, pulse 71, height 5\' 2"  (1.575 m), weight 211 lb (95.7 kg), last menstrual period 11/27/2010, SpO2 98 %.  General:  Well developed/well nourished in NAD Skin warm/dry Patient not depressed No peripheral clubbing Back-normal HEENT-normal/normal eyelids Neck supple/normal carotid upstroke bilaterally; no bruits; no JVD; no thyromegaly chest - CTA/ normal expansion CV - RRR/normal S1 and  S2; no murmurs, rubs or gallops;  PMI nondisplaced Abdomen -NT/ND, no HSM, no mass, + bowel sounds, no bruit 2+ femoral pulses, no bruits Ext-no edema, chords, 2+ DP Neuro-grossly nonfocal  ECG -normal sinus rhythm at a rate of 71, no ST changes.  Personally reviewed  A/P  1 coronary calcification-patient has mildly elevated calcium score.  She does not have chest pain.  Electrocardiogram is normal.  We discussed risk factor modification including diet and exercise.  We will also treat with Crestor.  2 hypertension-blood pressure is controlled.  Continue present medical regimen.  3 hyperlipidemia-given documented  coronary calcification I will advance Crestor.  I discussed increasing to 40 mg but she would prefer 20 mg to begin with as she had some side effects with atorvastatin.  We will check lipids and liver in 8 weeks if she tolerates.  If not we will need to consider Zetia or PCSK9 inhibitor.  Kirk Ruths, MD

## 2022-04-28 ENCOUNTER — Ambulatory Visit: Payer: BC Managed Care – PPO | Admitting: Cardiovascular Disease

## 2022-05-03 ENCOUNTER — Ambulatory Visit: Payer: BC Managed Care – PPO | Attending: Cardiovascular Disease | Admitting: Cardiology

## 2022-05-03 ENCOUNTER — Encounter: Payer: Self-pay | Admitting: Cardiology

## 2022-05-03 VITALS — BP 126/82 | HR 71 | Ht 62.0 in | Wt 211.0 lb

## 2022-05-03 DIAGNOSIS — I10 Essential (primary) hypertension: Secondary | ICD-10-CM | POA: Diagnosis not present

## 2022-05-03 DIAGNOSIS — I2510 Atherosclerotic heart disease of native coronary artery without angina pectoris: Secondary | ICD-10-CM

## 2022-05-03 DIAGNOSIS — E7800 Pure hypercholesterolemia, unspecified: Secondary | ICD-10-CM | POA: Diagnosis not present

## 2022-05-03 MED ORDER — ROSUVASTATIN CALCIUM 20 MG PO TABS: 20.0000 mg | ORAL_TABLET | Freq: Every day | ORAL | 3 refills | Status: DC

## 2022-05-03 NOTE — Patient Instructions (Signed)
Medication Instructions:  Increase Crestor to 20 mg daily Continue all other medications *If you need a refill on your cardiac medications before your next appointment, please call your pharmacy*   Lab Work: Lipid and Liver panels in 8 weeks  Lab order enclosed If you have labs (blood work) drawn today and your tests are completely normal, you will receive your results only by: [place] (if you have MyChart) OR A paper copy in the mail If you have any lab test that is abnormal or we need to change your treatment, we will call you to review the results.   Testing/Procedures: None ordered   Follow-Up: At [facility], you and your health needs are our priority.  As part of our continuing mission to provide you with exceptional heart care, we have created designated Provider Care Teams.  These Care Teams include your primary Cardiologist (physician) and Advanced Practice Providers (APPs -  Physician Assistants and Nurse Practitioners) who all work together to provide you with the care you need, when you need it.  We recommend signing up for the patient portal called "MyChart".  Sign up information is provided on this After Visit Summary.  MyChart is used to connect with patients for Virtual Visits (Telemedicine).  Patients are able to view lab/test results, encounter notes, upcoming appointments, etc.  Non-urgent messages can be sent to your provider as well.   To learn more about what you can do with MyChart, go to [email or web address].    Your next appointment:  1 year   Call in Dec to schedule March appointment     Provider:  Lubertha South

## 2022-06-22 DIAGNOSIS — N7689 Other specified inflammation of vagina and vulva: Secondary | ICD-10-CM | POA: Diagnosis not present

## 2022-06-22 DIAGNOSIS — L292 Pruritus vulvae: Secondary | ICD-10-CM | POA: Diagnosis not present

## 2022-06-24 DIAGNOSIS — I10 Essential (primary) hypertension: Secondary | ICD-10-CM | POA: Diagnosis not present

## 2022-06-24 DIAGNOSIS — E785 Hyperlipidemia, unspecified: Secondary | ICD-10-CM | POA: Diagnosis not present

## 2022-06-24 DIAGNOSIS — R718 Other abnormality of red blood cells: Secondary | ICD-10-CM | POA: Diagnosis not present

## 2022-06-24 DIAGNOSIS — E669 Obesity, unspecified: Secondary | ICD-10-CM | POA: Diagnosis not present

## 2022-12-20 DIAGNOSIS — E785 Hyperlipidemia, unspecified: Secondary | ICD-10-CM | POA: Diagnosis not present

## 2022-12-20 DIAGNOSIS — Z0000 Encounter for general adult medical examination without abnormal findings: Secondary | ICD-10-CM | POA: Diagnosis not present

## 2022-12-20 DIAGNOSIS — I10 Essential (primary) hypertension: Secondary | ICD-10-CM | POA: Diagnosis not present

## 2023-01-10 IMAGING — CT CT CARDIAC CORONARY ARTERY CALCIUM SCORE
3 series · 14 of 20 positions shown, 16 images · non-contrast
Comparison: None.
COMPARISON: None.

Addendum:
EXAM:
OVER-READ INTERPRETATION  CT CHEST

The following report is an over-read performed by radiologist Dr.
over-read does not include interpretation of cardiac or coronary
anatomy or pathology. The coronary calcium score interpretation by
the cardiologist is attached.

[Series 2: calcium scoring 2.00 qr36 bestdiast 69% hrt calciu · axial · 0.35mm/px · z∈[+1581,+1661]mm · 4 of 68 slices shown]
[im 14/68  vessel]
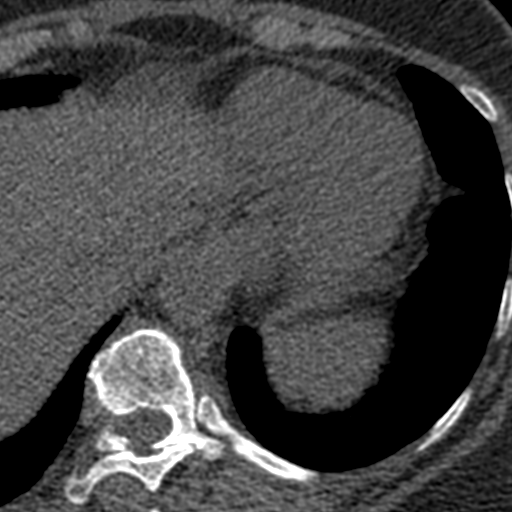
[im 27/68  vessel]
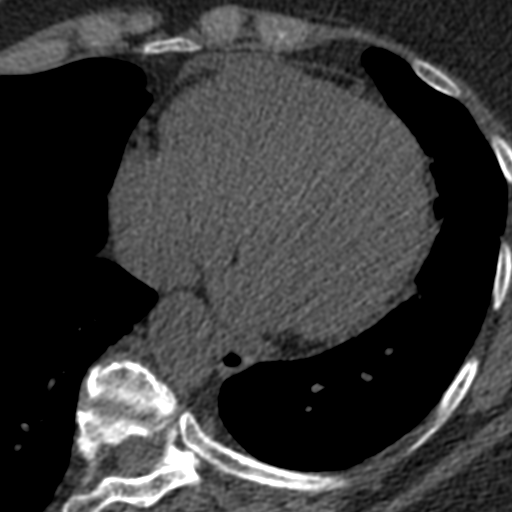
[im 41/68  vessel]
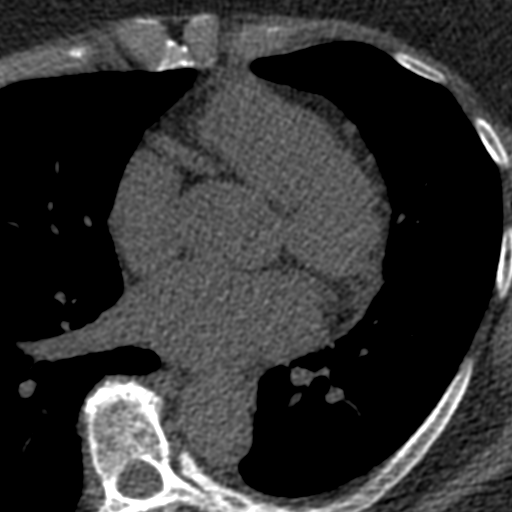
[im 54/68  vessel]
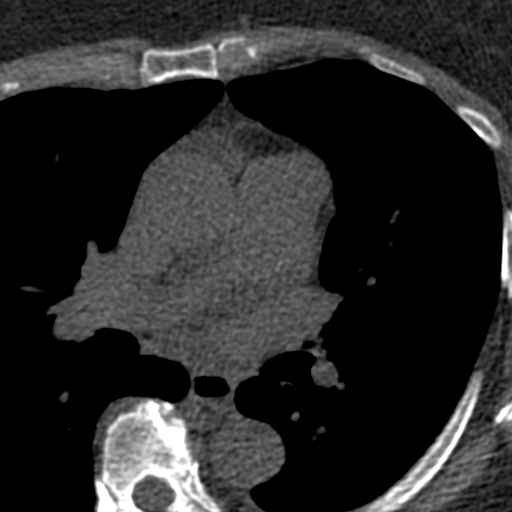

[Series 3: calcium scoring 2.00 br40 bestdiast 69% axial · axial · 0.59mm/px · z∈[+1577,+1665]mm · 5 of 68 slices shown, 7 images]
[im 12/68  vessel]
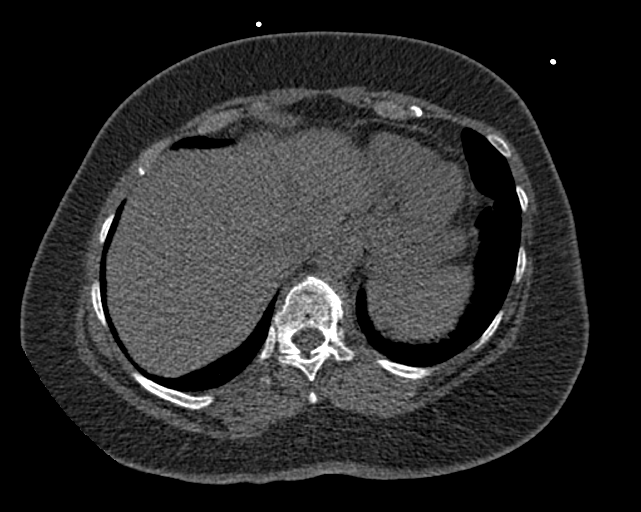
[im 12/68  lung]
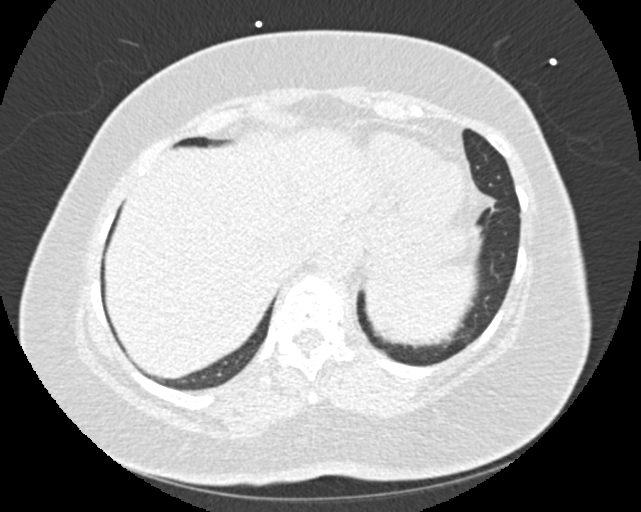
[im 23/68  vessel]
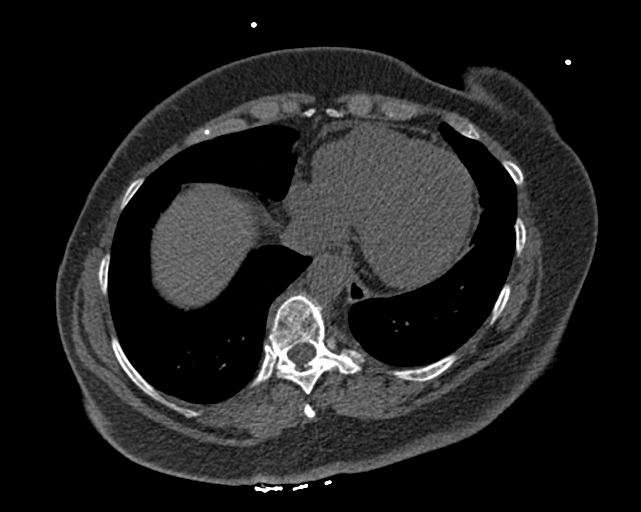
[im 34/68  vessel]
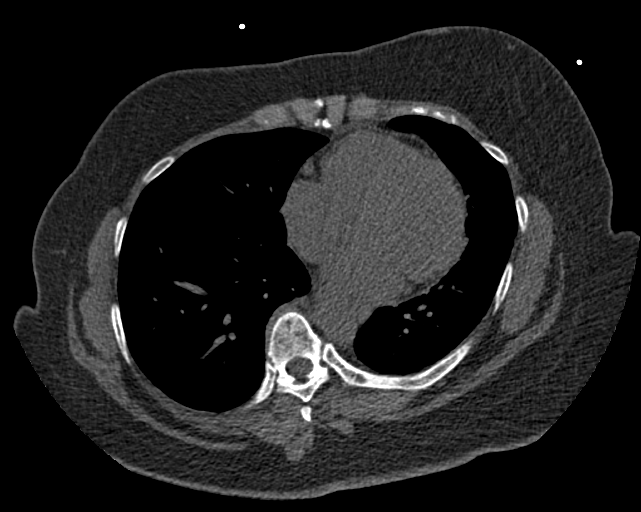
[im 45/68  vessel]
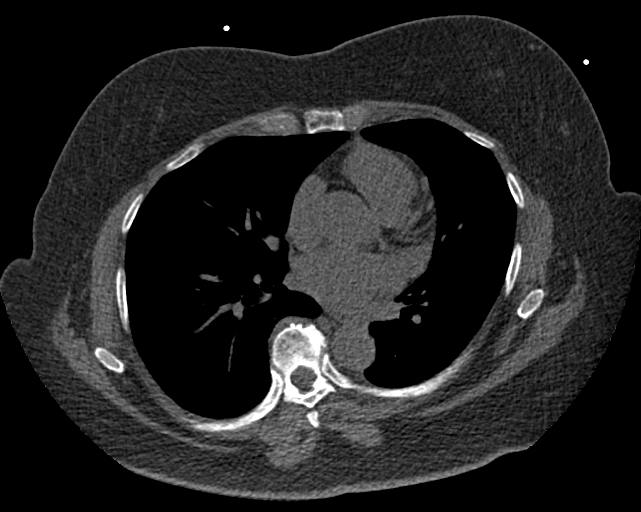
[im 56/68  vessel]
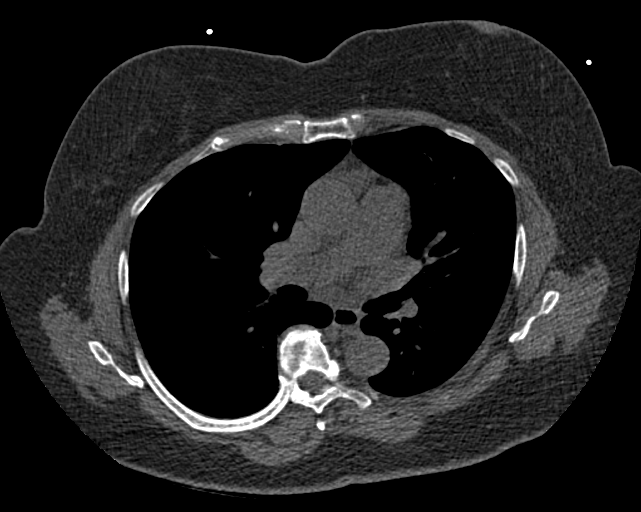
[im 56/68  lung]
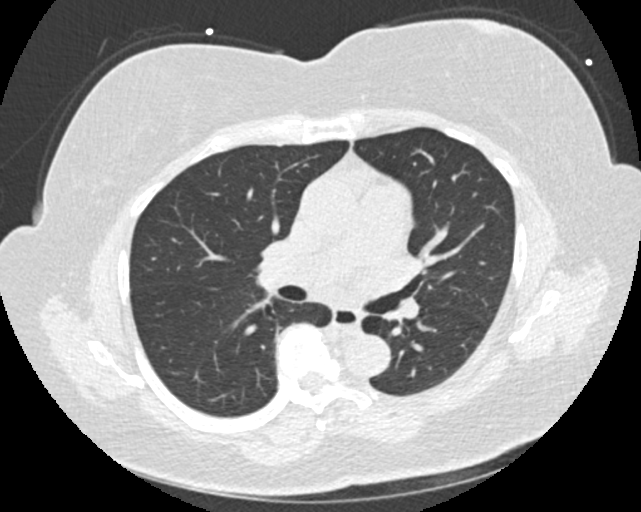

[Series 9: calcium scoring 2.00 br60 bestdiast 69% lungs · axial · 0.59mm/px · z∈[+1577,+1665]mm · 5 of 68 slices shown]
[im 12/68  vessel]
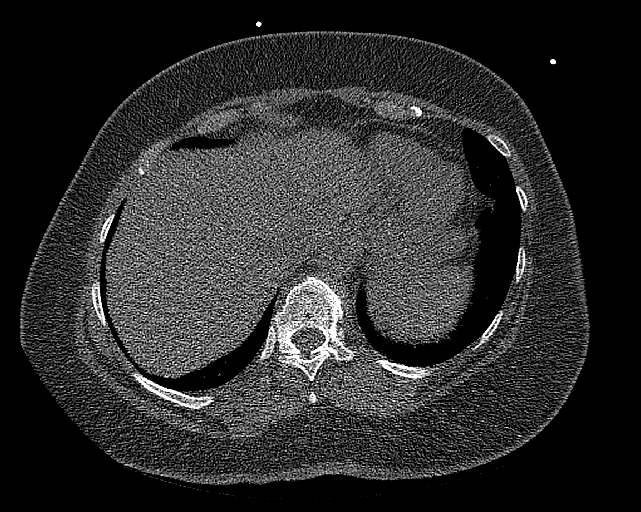
[im 23/68  vessel]
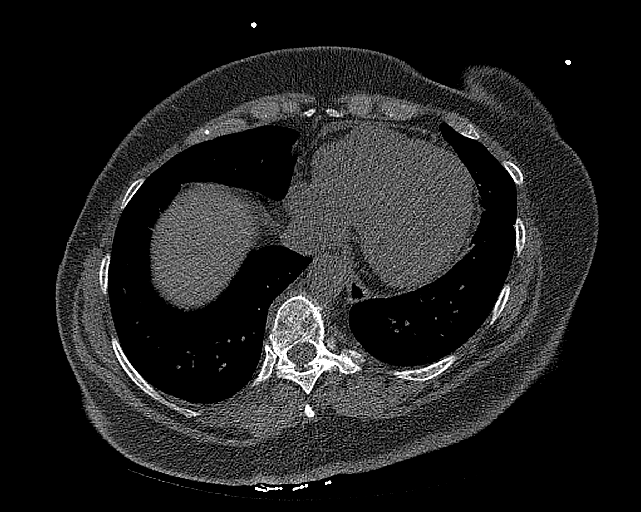
[im 34/68  vessel]
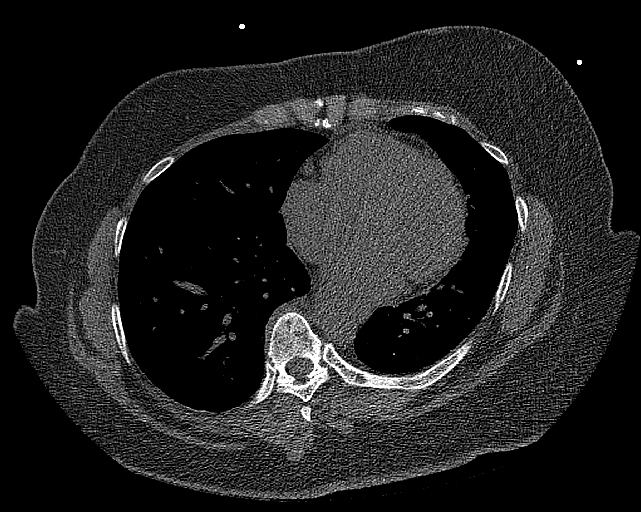
[im 45/68  vessel]
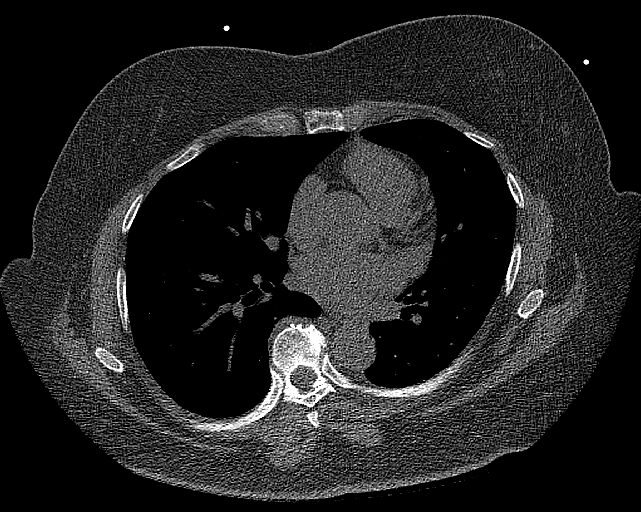
[im 56/68  vessel]
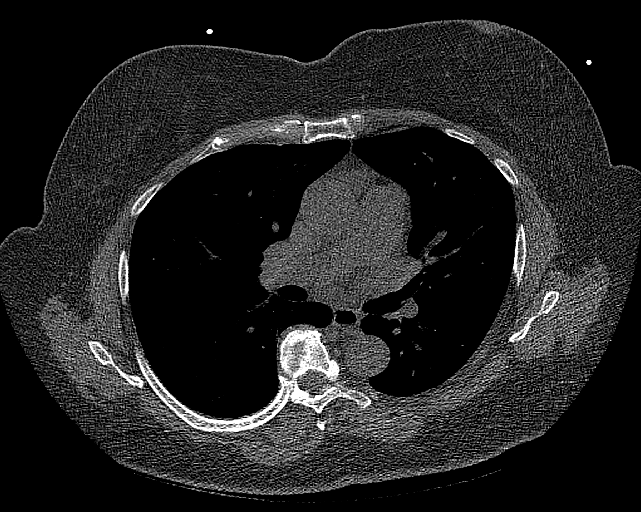

[14 of 20 positions shown; findings below may reference images not displayed]

FINDINGS: Heart: Trace pericardial fluid. No atherosclerotic disease of the
thoracic aorta.

Mediastinum: Mildly patulous esophagus. No pathologically enlarged
lymph nodes seen in the chest.

Lungs: Central airways are patent. No consolidation, pleural
effusion or pneumothorax.

Upper abdomen: No acute abnormality.

Musculoskeletal: No acute osseous abnormality.
IMPRESSION: No acute extracardiac abnormalities.

ADDENDUM:
CORONARY CALCIUM SCORES:

Left Main:

LAD: 0

LCx: 0

RCA: 0

Total Agatston Score:

[HOSPITAL] percentile: 76th

*** End of Addendum ***
EXAM:
OVER-READ INTERPRETATION  CT CHEST

The following report is an over-read performed by radiologist Dr.
over-read does not include interpretation of cardiac or coronary
anatomy or pathology. The coronary calcium score interpretation by
the cardiologist is attached.
FINDINGS: Heart: Trace pericardial fluid. No atherosclerotic disease of the
thoracic aorta.

Mediastinum: Mildly patulous esophagus. No pathologically enlarged
lymph nodes seen in the chest.

Lungs: Central airways are patent. No consolidation, pleural
effusion or pneumothorax.

Upper abdomen: No acute abnormality.

Musculoskeletal: No acute osseous abnormality.
IMPRESSION: No acute extracardiac abnormalities.

## 2023-03-22 DIAGNOSIS — J320 Chronic maxillary sinusitis: Secondary | ICD-10-CM | POA: Diagnosis not present

## 2023-04-23 ENCOUNTER — Other Ambulatory Visit: Payer: Self-pay | Admitting: Cardiology

## 2023-06-13 DIAGNOSIS — Z1231 Encounter for screening mammogram for malignant neoplasm of breast: Secondary | ICD-10-CM | POA: Diagnosis not present

## 2023-06-13 DIAGNOSIS — Z6838 Body mass index (BMI) 38.0-38.9, adult: Secondary | ICD-10-CM | POA: Diagnosis not present

## 2023-06-13 DIAGNOSIS — Z01419 Encounter for gynecological examination (general) (routine) without abnormal findings: Secondary | ICD-10-CM | POA: Diagnosis not present

## 2023-06-13 DIAGNOSIS — Z124 Encounter for screening for malignant neoplasm of cervix: Secondary | ICD-10-CM | POA: Diagnosis not present

## 2023-06-13 DIAGNOSIS — Z1151 Encounter for screening for human papillomavirus (HPV): Secondary | ICD-10-CM | POA: Diagnosis not present

## 2023-06-14 ENCOUNTER — Telehealth: Payer: Self-pay | Admitting: Cardiology

## 2023-06-14 DIAGNOSIS — E7800 Pure hypercholesterolemia, unspecified: Secondary | ICD-10-CM

## 2023-06-14 NOTE — Telephone Encounter (Signed)
 Patient wants to confirm orders for her to do lab work prior to visit on 7/22.

## 2023-06-14 NOTE — Telephone Encounter (Signed)
 Spoke with pt, aware will place the paperwork for the lab work needed in the mail to her to have drawn prior to her follow up appointment.

## 2023-06-19 DIAGNOSIS — E669 Obesity, unspecified: Secondary | ICD-10-CM | POA: Diagnosis not present

## 2023-06-19 DIAGNOSIS — I10 Essential (primary) hypertension: Secondary | ICD-10-CM | POA: Diagnosis not present

## 2023-06-19 DIAGNOSIS — E785 Hyperlipidemia, unspecified: Secondary | ICD-10-CM | POA: Diagnosis not present

## 2023-06-19 DIAGNOSIS — J310 Chronic rhinitis: Secondary | ICD-10-CM | POA: Diagnosis not present

## 2023-08-16 NOTE — Progress Notes (Signed)
 HPI: FU coronary calcification.  Calcium  score March 2023 13.1 (in left main) which was 76 percentile.  Since last seen the patient has dyspnea with more extreme activities but not with routine activities. It is relieved with rest. It is not associated with chest pain. There is no orthopnea, PND or pedal edema. There is no syncope or palpitations. There is no exertional chest pain.    Current Outpatient Medications  Medication Sig Dispense Refill   acyclovir (ZOVIRAX) 400 MG tablet acyclovir 400 mg tablet     amLODipine (NORVASC) 10 MG tablet Take 10 mg by mouth daily.  1   betamethasone valerate ointment (VALISONE) 0.1 % Apply topically 2 (two) times daily.     losartan (COZAAR) 50 MG tablet Take 50 mg by mouth daily.  1   rosuvastatin  (CRESTOR ) 20 MG tablet TAKE 1 TABLET BY MOUTH EVERY DAY 90 tablet 3   cetirizine (ZYRTEC) 10 MG tablet Take 10 mg by mouth daily. (Patient not taking: Reported on 08/29/2023)     No current facility-administered medications for this visit.     Past Medical History:  Diagnosis Date   Anxiety    Hyperlipidemia    Hypertension     Past Surgical History:  Procedure Laterality Date   APPENDECTOMY  1966   COLONOSCOPY     POLYPECTOMY     TONSILLECTOMY  1968    Social History   Socioeconomic History   Marital status: Married    Spouse name: Not on file   Number of children: 3   Years of education: Not on file   Highest education level: Not on file  Occupational History   Not on file  Tobacco Use   Smoking status: Never   Smokeless tobacco: Never  Vaping Use   Vaping status: Never Used  Substance and Sexual Activity   Alcohol use: No   Drug use: No   Sexual activity: Yes    Birth control/protection: Post-menopausal  Other Topics Concern   Not on file  Social History Narrative   Not on file   Social Drivers of Health   Financial Resource Strain: Not on file  Food Insecurity: Not on file  Transportation Needs: Not on file   Physical Activity: Not on file  Stress: Not on file  Social Connections: Not on file  Intimate Partner Violence: Not on file    Family History  Problem Relation Age of Onset   Hypertension Mother    Heart attack Father    Heart attack Brother    Colon cancer Neg Hx    Stomach cancer Neg Hx    Colon polyps Neg Hx    Esophageal cancer Neg Hx    Pancreatic cancer Neg Hx    Rectal cancer Neg Hx     ROS: no fevers or chills, productive cough, hemoptysis, dysphasia, odynophagia, melena, hematochezia, dysuria, hematuria, rash, seizure activity, orthopnea, PND, pedal edema, claudication. Remaining systems are negative.  Physical Exam: Well-developed well-nourished in no acute distress.  Skin is warm and dry.  HEENT is normal.  Neck is supple.  Chest is clear to auscultation with normal expansion.  Cardiovascular exam is regular rate and rhythm.  Abdominal exam nontender or distended. No masses palpated. Extremities show no edema. neuro grossly intact  EKG Interpretation Date/Time:  Tuesday August 29 2023 08:44:07 EDT Ventricular Rate:  73 PR Interval:  150 QRS Duration:  74 QT Interval:  380 QTC Calculation: 418 R Axis:   -11  Text Interpretation:  Normal sinus rhythm Cannot rule out Anterior infarct , age undetermined Confirmed by Pietro Rogue ([id number]) on 08/29/2023 8:44:52 AM    A/P  1 coronary calcification-patient denies chest pain.  Continue statin.  2 hyperlipidemia-continue Crestor  at present dose.  Laboratories July 2025 showed normal liver functions and LDL 41.  3 hypertension-patient's blood pressure is controlled.  Continue present medications.  Rogue Pietro, MD

## 2023-08-21 DIAGNOSIS — E7800 Pure hypercholesterolemia, unspecified: Secondary | ICD-10-CM | POA: Diagnosis not present

## 2023-08-21 LAB — LIPID PANEL

## 2023-08-22 ENCOUNTER — Ambulatory Visit: Payer: Self-pay | Admitting: Cardiology

## 2023-08-22 LAB — LIPID PANEL
Cholesterol, Total: 122 mg/dL (ref 100–199)
HDL: 67 mg/dL (ref >39)
LDL CALC COMMENT:: 1.8 ratio (ref 0.0–4.4)
LDL Chol Calc (NIH): 41 mg/dL (ref 0–99)
Triglycerides: 65 mg/dL (ref 0–149)
VLDL Cholesterol Cal: 14 mg/dL (ref 5–40)

## 2023-08-22 LAB — COMPREHENSIVE METABOLIC PANEL WITH GFR
ALT: 16 IU/L (ref 0–32)
AST: 18 IU/L (ref 0–40)
Albumin: 4.5 g/dL (ref 3.9–4.9)
Alkaline Phosphatase: 90 IU/L (ref 44–121)
BUN/Creatinine Ratio: 15 (ref 12–28)
BUN: 12 mg/dL (ref 8–27)
Bilirubin Total: 0.8 mg/dL (ref 0.0–1.2)
CO2: 21 mmol/L (ref 20–29)
Calcium: 9.8 mg/dL (ref 8.7–10.3)
Chloride: 106 mmol/L (ref 96–106)
Creatinine, Ser: 0.81 mg/dL (ref 0.57–1.00)
Globulin, Total: 2.3 g/dL (ref 1.5–4.5)
Glucose: 94 mg/dL (ref 70–99)
Potassium: 3.9 mmol/L (ref 3.5–5.2)
Sodium: 141 mmol/L (ref 134–144)
Total Protein: 6.8 g/dL (ref 6.0–8.5)
eGFR: 81 mL/min/1.73 (ref >59)

## 2023-08-29 ENCOUNTER — Ambulatory Visit: Attending: Cardiology | Admitting: Cardiology

## 2023-08-29 ENCOUNTER — Encounter (INDEPENDENT_AMBULATORY_CARE_PROVIDER_SITE_OTHER): Payer: Self-pay | Admitting: Otolaryngology

## 2023-08-29 ENCOUNTER — Encounter: Payer: Self-pay | Admitting: Cardiology

## 2023-08-29 ENCOUNTER — Ambulatory Visit (INDEPENDENT_AMBULATORY_CARE_PROVIDER_SITE_OTHER): Admitting: Otolaryngology

## 2023-08-29 VITALS — BP 120/86 | HR 73 | Ht 62.0 in | Wt 202.0 lb

## 2023-08-29 VITALS — BP 127/86 | HR 68 | Ht 62.0 in | Wt 197.0 lb

## 2023-08-29 DIAGNOSIS — I2510 Atherosclerotic heart disease of native coronary artery without angina pectoris: Secondary | ICD-10-CM | POA: Diagnosis not present

## 2023-08-29 DIAGNOSIS — E7800 Pure hypercholesterolemia, unspecified: Secondary | ICD-10-CM | POA: Diagnosis not present

## 2023-08-29 DIAGNOSIS — J310 Chronic rhinitis: Secondary | ICD-10-CM | POA: Diagnosis not present

## 2023-08-29 DIAGNOSIS — R0981 Nasal congestion: Secondary | ICD-10-CM

## 2023-08-29 DIAGNOSIS — J343 Hypertrophy of nasal turbinates: Secondary | ICD-10-CM

## 2023-08-29 DIAGNOSIS — I10 Essential (primary) hypertension: Secondary | ICD-10-CM

## 2023-08-29 DIAGNOSIS — J342 Deviated nasal septum: Secondary | ICD-10-CM

## 2023-08-29 DIAGNOSIS — J320 Chronic maxillary sinusitis: Secondary | ICD-10-CM

## 2023-08-29 NOTE — Patient Instructions (Signed)

## 2023-08-30 DIAGNOSIS — J320 Chronic maxillary sinusitis: Secondary | ICD-10-CM | POA: Insufficient documentation

## 2023-08-30 DIAGNOSIS — J342 Deviated nasal septum: Secondary | ICD-10-CM | POA: Insufficient documentation

## 2023-08-30 DIAGNOSIS — J343 Hypertrophy of nasal turbinates: Secondary | ICD-10-CM | POA: Insufficient documentation

## 2023-08-30 DIAGNOSIS — J310 Chronic rhinitis: Secondary | ICD-10-CM | POA: Insufficient documentation

## 2023-08-30 NOTE — Progress Notes (Signed)
 CC: Right-sided facial pain  HPI:  Linda Barajas is a 63 y.o. female who presents today complaining of recurrent right-sided facial pain for the past 9 months.  She was seen by a dentist, and no significant dental abnormality was noted.  She was therefore referred for evaluation of possible sinus causes of her right midface pain.  The patient denies any history of environmental allergies.  She is able to breathe through her nose.  She has no previous nasal surgery.  She underwent adenotonsillectomy as a child.  She has no recent known sinusitis.  Past Medical History:  Diagnosis Date   Anxiety    Hyperlipidemia    Hypertension     Past Surgical History:  Procedure Laterality Date   APPENDECTOMY  1966   COLONOSCOPY     POLYPECTOMY     TONSILLECTOMY  1968    Family History  Problem Relation Age of Onset   Hypertension Mother    Heart attack Father    Heart attack Brother    Colon cancer Neg Hx    Stomach cancer Neg Hx    Colon polyps Neg Hx    Esophageal cancer Neg Hx    Pancreatic cancer Neg Hx    Rectal cancer Neg Hx     Social History:  reports that she has never smoked. She has never used smokeless tobacco. She reports that she does not drink alcohol and does not use drugs.  Allergies:  Allergies  Allergen Reactions   Atorvastatin Calcium  Other (See Comments)   Lisinopril-Hydrochlorothiazide Other (See Comments)   Other Other (See Comments)    Cholesterol medication, patient unsure of name Reaction is dizziness    Prior to Admission medications   Medication Sig Start Date End Date Taking? Authorizing Provider  acyclovir (ZOVIRAX) 400 MG tablet acyclovir 400 mg tablet   Yes [provider]  amLODipine (NORVASC) 10 MG tablet Take 10 mg by mouth daily. 07/02/17  Yes [provider]  betamethasone valerate ointment (VALISONE) 0.1 % Apply topically 2 (two) times daily.   Yes [provider]  losartan (COZAAR) 50 MG tablet Take 50 mg by mouth  daily. 07/02/17  Yes [provider]  rosuvastatin  (CRESTOR ) 20 MG tablet TAKE 1 TABLET BY MOUTH EVERY DAY 04/24/23  Yes Pietro Redell RAMAN, MD  cetirizine (ZYRTEC) 10 MG tablet Take 10 mg by mouth daily. Patient not taking: Reported on 08/29/2023 06/09/21   [provider]    Blood pressure 127/86, pulse 68, height 5' 2 (1.575 m), weight 197 lb (89.4 kg), last menstrual period 11/27/2010, SpO2 96%. Exam: General: Communicates without difficulty, well nourished, no acute distress. Head: Normocephalic, no evidence injury, no tenderness, facial buttresses intact without stepoff. Face/sinus: No tenderness to palpation and percussion. Facial movement is normal and symmetric. Eyes: PERRL, EOMI. No scleral icterus, conjunctivae clear. Neuro: CN II exam reveals vision grossly intact.  No nystagmus at any point of gaze. Ears: Auricles well formed without lesions.  Ear canals are intact without mass or lesion.  No erythema or edema is appreciated.  The TMs are intact without fluid. Nose: External evaluation reveals normal support and skin without lesions.  Dorsum is intact.  Anterior rhinoscopy reveals congested mucosa over anterior aspect of inferior turbinates and intact septum.  No purulence noted. Oral:  Oral cavity and oropharynx are intact, symmetric, without erythema or edema.  Mucosa is moist without lesions. Neck: Full range of motion without pain.  There is no significant lymphadenopathy.  No masses  palpable.  Thyroid bed within normal limits to palpation.  Parotid glands and submandibular glands equal bilaterally without mass.  Trachea is midline. Neuro:  CN 2-12 grossly intact.   Procedure:  Flexible Nasal Endoscopy: Description: Risks, benefits, and alternatives of flexible endoscopy were explained to the patient.  Specific mention was made of the risk of throat numbness with difficulty swallowing, possible bleeding from the nose and mouth, and pain from the procedure.  The patient gave  oral consent to proceed.  The flexible scope was inserted into the right nasal cavity.  Endoscopy of the interior nasal cavity, superior, inferior, and middle meatus was performed. The sphenoid-ethmoid recess was examined. Edematous mucosa was noted.  No polyp, mass, or lesion was appreciated. Nasal septal deviation noted. Olfactory cleft was clear.  Nasopharynx was clear.  Turbinates were hypertrophied but without mass.  The procedure was repeated on the contralateral side with similar findings.  The patient tolerated the procedure well.   Assessment: 1.  Chronic rhinitis with nasal mucosal congestion, nasal septal deviation, and bilateral inferior turbinate hypertrophy. 2.  No obvious purulent drainage or acute infection to explain her frequent recurrent right facial pain. 3.  It is possible her right facial pain may be neurogenic in etiology.  Plan: 1.  The physical exam and nasal endoscopy findings are reviewed with the patient. 2.  Flonase nasal spray to treat the chronic rhinitis. 3.  Sinus CT scan to evaluate for chronic rhinosinusitis. 4.  The patient will return for reevaluation after her sinus CT scan.  Surie Suchocki W Heron Pitcock 08/30/2023, 10:28 AM

## 2023-09-28 ENCOUNTER — Ambulatory Visit (INDEPENDENT_AMBULATORY_CARE_PROVIDER_SITE_OTHER): Admitting: Otolaryngology

## 2023-09-29 ENCOUNTER — Ambulatory Visit (HOSPITAL_BASED_OUTPATIENT_CLINIC_OR_DEPARTMENT_OTHER)
Admission: RE | Admit: 2023-09-29 | Discharge: 2023-09-29 | Disposition: A | Discharge status: Home / Self Care | Source: Ambulatory Visit | Attending: Otolaryngology | Admitting: Otolaryngology

## 2023-09-29 DIAGNOSIS — J320 Chronic maxillary sinusitis: Secondary | ICD-10-CM | POA: Diagnosis not present

## 2023-10-11 ENCOUNTER — Encounter (INDEPENDENT_AMBULATORY_CARE_PROVIDER_SITE_OTHER): Payer: Self-pay | Admitting: Otolaryngology

## 2023-10-11 ENCOUNTER — Ambulatory Visit (INDEPENDENT_AMBULATORY_CARE_PROVIDER_SITE_OTHER): Admitting: Otolaryngology

## 2023-10-11 VITALS — BP 141/87 | HR 84

## 2023-10-11 DIAGNOSIS — R519 Headache, unspecified: Secondary | ICD-10-CM

## 2023-10-11 DIAGNOSIS — R0981 Nasal congestion: Secondary | ICD-10-CM

## 2023-10-11 DIAGNOSIS — J310 Chronic rhinitis: Secondary | ICD-10-CM | POA: Diagnosis not present

## 2023-10-11 DIAGNOSIS — J342 Deviated nasal septum: Secondary | ICD-10-CM | POA: Diagnosis not present

## 2023-10-11 DIAGNOSIS — J3489 Other specified disorders of nose and nasal sinuses: Secondary | ICD-10-CM

## 2023-10-11 DIAGNOSIS — J343 Hypertrophy of nasal turbinates: Secondary | ICD-10-CM

## 2023-10-12 NOTE — Progress Notes (Signed)
 Patient ID: Linda Barajas, female   DOB: 11/09/1960, 63 y.o.   MRN: [id number]  Follow-up: Right-sided facial pain  HPI: The patient is a 63 year old female who returns today for her follow-up evaluation.  She was last seen in July 2025.  At that time, she was complaining of recurrent right sided facial pain for approximately 9 months.  On examination, the patient was noted to have nasal mucosal congestion, nasal septal deviation, and bilateral inferior turbinate hypertrophy.  No obvious infection was noted at that time.  She subsequently underwent a CT scan.  The CT showed a left maxillary mucous retention cyst.  Otherwise no paranasal sinus disease was noted.  Nasal septal deviation, bilateral concha bullosa, and bilateral inferior turbinate hypertrophy were noted on the CT scan.  The patient returns today complaining of persistent right-sided facial pain.  She denies any significant breathing difficulty, fever, or visual change.  Exam: General: Communicates without difficulty, well nourished, no acute distress. Head: Normocephalic, no evidence injury, no tenderness, facial buttresses intact without stepoff. Face/sinus: No tenderness to palpation and percussion. Facial movement is normal and symmetric. Eyes: PERRL, EOMI. No scleral icterus, conjunctivae clear. Neuro: CN II exam reveals vision grossly intact.  No nystagmus at any point of gaze. Ears: Auricles well formed without lesions.  Ear canals are intact without mass or lesion.  No erythema or edema is appreciated.  The TMs are intact without fluid. Nose: External evaluation reveals normal support and skin without lesions.  Dorsum is intact.  Anterior rhinoscopy reveals congested mucosa over anterior aspect of inferior turbinates and deviated septum.  No purulence noted. Oral:  Oral cavity and oropharynx are intact, symmetric, without erythema or edema.  Mucosa is moist without lesions. Neck: Full range of motion without pain.  There is no significant  lymphadenopathy.  No masses palpable.  Thyroid bed within normal limits to palpation.  Parotid glands and submandibular glands equal bilaterally without mass.  Trachea is midline. Neuro:  CN 2-12 grossly intact.   Assessment: 1.  Chronic rhinitis with nasal mucosal congestion, nasal septal deviation, bilateral concha bullosa, and bilateral inferior turbinate hypertrophy. 2.  Her recent CT scan did not show any significant sinus disease.  She has an incidental finding of a left maxillary mucous retention cyst. 3.  Her recurrent right facial pain is likely neurogenic in etiology.  Plan: 1.  The physical exam findings and the CT images are reviewed with the patient. 2.  The patient is reassured and no acute infection is noted today. 3.  The patient may benefit from a neurology evaluation regarding her recurrent right facial pain. 4.  The patient is encouraged to call with any questions or concerns.

## 2023-12-26 DIAGNOSIS — Z780 Asymptomatic menopausal state: Secondary | ICD-10-CM | POA: Diagnosis not present

## 2023-12-26 DIAGNOSIS — Z0000 Encounter for general adult medical examination without abnormal findings: Secondary | ICD-10-CM | POA: Diagnosis not present

## 2023-12-26 DIAGNOSIS — E669 Obesity, unspecified: Secondary | ICD-10-CM | POA: Diagnosis not present

## 2023-12-26 DIAGNOSIS — E785 Hyperlipidemia, unspecified: Secondary | ICD-10-CM | POA: Diagnosis not present

## 2023-12-26 DIAGNOSIS — I10 Essential (primary) hypertension: Secondary | ICD-10-CM | POA: Diagnosis not present
